# Patient Record
Sex: Female | Born: 1935 | Race: White | Hispanic: No | Smoking: Former smoker
Health system: Southern US, Community
[De-identification: ages and names within clinical notes are randomized; demographics above are authoritative.]

## PROBLEM LIST (undated history)

## (undated) DIAGNOSIS — J449 Chronic obstructive pulmonary disease, unspecified: Secondary | ICD-10-CM

## (undated) DIAGNOSIS — F419 Anxiety disorder, unspecified: Secondary | ICD-10-CM

## (undated) DIAGNOSIS — M1990 Unspecified osteoarthritis, unspecified site: Secondary | ICD-10-CM

## (undated) DIAGNOSIS — I639 Cerebral infarction, unspecified: Secondary | ICD-10-CM

## (undated) DIAGNOSIS — I10 Essential (primary) hypertension: Secondary | ICD-10-CM

## (undated) DIAGNOSIS — E119 Type 2 diabetes mellitus without complications: Secondary | ICD-10-CM

## (undated) HISTORY — PX: ESOPHAGEAL DILATION: SHX303

## (undated) HISTORY — PX: OTHER SURGICAL HISTORY: SHX169

---

## 2001-07-15 ENCOUNTER — Emergency Department (HOSPITAL_COMMUNITY): Admission: EM | Admit: 2001-07-15 | Discharge: 2001-07-15 | Payer: Self-pay | Admitting: Emergency Medicine

## 2001-07-15 ENCOUNTER — Encounter: Payer: Self-pay | Admitting: Emergency Medicine

## 2002-11-11 ENCOUNTER — Emergency Department (HOSPITAL_COMMUNITY): Admission: EM | Admit: 2002-11-11 | Discharge: 2002-11-11 | Payer: Self-pay | Admitting: Emergency Medicine

## 2003-04-26 ENCOUNTER — Ambulatory Visit (HOSPITAL_COMMUNITY): Admission: RE | Admit: 2003-04-26 | Discharge: 2003-04-26 | Payer: Self-pay | Admitting: Ophthalmology

## 2003-04-28 ENCOUNTER — Encounter: Payer: Self-pay | Admitting: Ophthalmology

## 2003-04-28 ENCOUNTER — Ambulatory Visit (HOSPITAL_COMMUNITY): Admission: RE | Admit: 2003-04-28 | Discharge: 2003-04-29 | Payer: Self-pay | Admitting: Ophthalmology

## 2003-05-15 ENCOUNTER — Encounter: Payer: Self-pay | Admitting: *Deleted

## 2003-05-15 ENCOUNTER — Emergency Department (HOSPITAL_COMMUNITY): Admission: EM | Admit: 2003-05-15 | Discharge: 2003-05-15 | Payer: Self-pay | Admitting: *Deleted

## 2003-12-11 ENCOUNTER — Emergency Department (HOSPITAL_COMMUNITY): Admission: EM | Admit: 2003-12-11 | Discharge: 2003-12-12 | Payer: Self-pay | Admitting: Emergency Medicine

## 2004-08-19 ENCOUNTER — Emergency Department (HOSPITAL_COMMUNITY): Admission: EM | Admit: 2004-08-19 | Discharge: 2004-08-19 | Payer: Self-pay | Admitting: Emergency Medicine

## 2005-06-28 ENCOUNTER — Other Ambulatory Visit: Admission: RE | Admit: 2005-06-28 | Discharge: 2005-06-28 | Payer: Self-pay | Admitting: Family Medicine

## 2005-08-19 ENCOUNTER — Observation Stay (HOSPITAL_COMMUNITY): Admission: EM | Admit: 2005-08-19 | Discharge: 2005-08-22 | Payer: Self-pay | Admitting: Emergency Medicine

## 2005-08-20 ENCOUNTER — Ambulatory Visit: Payer: Self-pay | Admitting: Internal Medicine

## 2008-10-13 ENCOUNTER — Ambulatory Visit (HOSPITAL_COMMUNITY): Admission: RE | Admit: 2008-10-13 | Discharge: 2008-10-13 | Payer: Self-pay | Admitting: Family Medicine

## 2008-11-13 ENCOUNTER — Inpatient Hospital Stay (HOSPITAL_COMMUNITY): Admission: EM | Admit: 2008-11-13 | Discharge: 2008-11-17 | Payer: Self-pay | Admitting: Emergency Medicine

## 2009-01-29 ENCOUNTER — Inpatient Hospital Stay (HOSPITAL_COMMUNITY): Admission: EM | Admit: 2009-01-29 | Discharge: 2009-02-03 | Payer: Self-pay | Admitting: Emergency Medicine

## 2009-01-30 ENCOUNTER — Ambulatory Visit: Payer: Self-pay | Admitting: Internal Medicine

## 2009-01-31 ENCOUNTER — Ambulatory Visit: Payer: Self-pay | Admitting: Internal Medicine

## 2009-03-20 ENCOUNTER — Ambulatory Visit (HOSPITAL_COMMUNITY): Admission: RE | Admit: 2009-03-20 | Discharge: 2009-03-20 | Payer: Self-pay | Admitting: Family Medicine

## 2009-05-27 ENCOUNTER — Emergency Department (HOSPITAL_COMMUNITY): Admission: EM | Admit: 2009-05-27 | Discharge: 2009-05-27 | Payer: Self-pay | Admitting: Emergency Medicine

## 2009-06-07 ENCOUNTER — Ambulatory Visit (HOSPITAL_COMMUNITY): Admission: RE | Admit: 2009-06-07 | Discharge: 2009-06-07 | Payer: Self-pay | Admitting: Family Medicine

## 2009-10-04 ENCOUNTER — Ambulatory Visit (HOSPITAL_COMMUNITY): Admission: RE | Admit: 2009-10-04 | Discharge: 2009-10-04 | Payer: Self-pay | Admitting: Family Medicine

## 2010-10-22 ENCOUNTER — Inpatient Hospital Stay (HOSPITAL_COMMUNITY)
Admission: EM | Admit: 2010-10-22 | Discharge: 2010-10-29 | Payer: Self-pay | Source: Home / Self Care | Admitting: Emergency Medicine

## 2010-10-29 ENCOUNTER — Inpatient Hospital Stay
Admission: AD | Admit: 2010-10-29 | Discharge: 2010-12-05 | Disposition: A | Discharge status: Other Acute Inpatient Hospital | Payer: Self-pay | Source: Home / Self Care | Attending: Family Medicine | Admitting: Family Medicine

## 2010-11-23 ENCOUNTER — Ambulatory Visit (HOSPITAL_COMMUNITY)
Admission: RE | Admit: 2010-11-23 | Discharge: 2010-11-23 | Payer: Self-pay | Source: Home / Self Care | Attending: Orthopaedic Surgery | Admitting: Orthopaedic Surgery

## 2010-12-05 ENCOUNTER — Inpatient Hospital Stay (HOSPITAL_COMMUNITY)
Admission: EM | Admit: 2010-12-05 | Discharge: 2010-12-07 | Payer: Self-pay | Source: Home / Self Care | Attending: Family Medicine | Admitting: Family Medicine

## 2010-12-07 ENCOUNTER — Inpatient Hospital Stay
Admission: AD | Admit: 2010-12-07 | Discharge: 2011-01-02 | Disposition: A | Discharge status: Other Acute Inpatient Hospital | Payer: Self-pay | Source: Home / Self Care | Attending: Family Medicine | Admitting: Family Medicine

## 2010-12-15 ENCOUNTER — Ambulatory Visit (HOSPITAL_COMMUNITY)
Admission: RE | Admit: 2010-12-15 | Discharge: 2010-12-15 | Payer: Self-pay | Source: Home / Self Care | Attending: Family Medicine | Admitting: Family Medicine

## 2010-12-19 LAB — GLUCOSE, CAPILLARY
Glucose-Capillary: 125 mg/dL — ABNORMAL HIGH (ref 70–99)
Glucose-Capillary: 349 mg/dL — ABNORMAL HIGH (ref 70–99)
Glucose-Capillary: 137 mg/dL — ABNORMAL HIGH (ref 70–99)
Glucose-Capillary: 198 mg/dL — ABNORMAL HIGH (ref 70–99)

## 2010-12-20 LAB — GLUCOSE, CAPILLARY
Glucose-Capillary: 148 mg/dL — ABNORMAL HIGH (ref 70–99)
Glucose-Capillary: 192 mg/dL — ABNORMAL HIGH (ref 70–99)
Glucose-Capillary: 281 mg/dL — ABNORMAL HIGH (ref 70–99)
Glucose-Capillary: 283 mg/dL — ABNORMAL HIGH (ref 70–99)

## 2010-12-21 LAB — GLUCOSE, CAPILLARY
Glucose-Capillary: 164 mg/dL — ABNORMAL HIGH (ref 70–99)
Glucose-Capillary: 162 mg/dL — ABNORMAL HIGH (ref 70–99)

## 2010-12-31 LAB — GLUCOSE, CAPILLARY
Glucose-Capillary: 207 mg/dL — ABNORMAL HIGH (ref 70–99)
Glucose-Capillary: 286 mg/dL — ABNORMAL HIGH (ref 70–99)
Glucose-Capillary: 126 mg/dL — ABNORMAL HIGH (ref 70–99)
Glucose-Capillary: 166 mg/dL — ABNORMAL HIGH (ref 70–99)
Glucose-Capillary: 268 mg/dL — ABNORMAL HIGH (ref 70–99)
Glucose-Capillary: 172 mg/dL — ABNORMAL HIGH (ref 70–99)
Glucose-Capillary: 122 mg/dL — ABNORMAL HIGH (ref 70–99)
Glucose-Capillary: 242 mg/dL — ABNORMAL HIGH (ref 70–99)
Glucose-Capillary: 302 mg/dL — ABNORMAL HIGH (ref 70–99)
Glucose-Capillary: 299 mg/dL — ABNORMAL HIGH (ref 70–99)
Glucose-Capillary: 287 mg/dL — ABNORMAL HIGH (ref 70–99)
Glucose-Capillary: 202 mg/dL — ABNORMAL HIGH (ref 70–99)
Glucose-Capillary: 196 mg/dL — ABNORMAL HIGH (ref 70–99)
Glucose-Capillary: 205 mg/dL — ABNORMAL HIGH (ref 70–99)
Glucose-Capillary: 148 mg/dL — ABNORMAL HIGH (ref 70–99)
Glucose-Capillary: 245 mg/dL — ABNORMAL HIGH (ref 70–99)
Glucose-Capillary: 290 mg/dL — ABNORMAL HIGH (ref 70–99)
Glucose-Capillary: 364 mg/dL — ABNORMAL HIGH (ref 70–99)
Glucose-Capillary: 262 mg/dL — ABNORMAL HIGH (ref 70–99)
Glucose-Capillary: 130 mg/dL — ABNORMAL HIGH (ref 70–99)
Glucose-Capillary: 171 mg/dL — ABNORMAL HIGH (ref 70–99)
Glucose-Capillary: 232 mg/dL — ABNORMAL HIGH (ref 70–99)
Glucose-Capillary: 325 mg/dL — ABNORMAL HIGH (ref 70–99)
Glucose-Capillary: 117 mg/dL — ABNORMAL HIGH (ref 70–99)
Glucose-Capillary: 117 mg/dL — ABNORMAL HIGH (ref 70–99)
Glucose-Capillary: 182 mg/dL — ABNORMAL HIGH (ref 70–99)
Glucose-Capillary: 202 mg/dL — ABNORMAL HIGH (ref 70–99)
Glucose-Capillary: 160 mg/dL — ABNORMAL HIGH (ref 70–99)
Glucose-Capillary: 201 mg/dL — ABNORMAL HIGH (ref 70–99)
Glucose-Capillary: 95 mg/dL (ref 70–99)
Glucose-Capillary: 104 mg/dL — ABNORMAL HIGH (ref 70–99)
Glucose-Capillary: 219 mg/dL — ABNORMAL HIGH (ref 70–99)
Glucose-Capillary: 267 mg/dL — ABNORMAL HIGH (ref 70–99)
Glucose-Capillary: 53 mg/dL — ABNORMAL LOW (ref 70–99)
Glucose-Capillary: 147 mg/dL — ABNORMAL HIGH (ref 70–99)
Glucose-Capillary: 267 mg/dL — ABNORMAL HIGH (ref 70–99)
Glucose-Capillary: 165 mg/dL — ABNORMAL HIGH (ref 70–99)
Glucose-Capillary: 257 mg/dL — ABNORMAL HIGH (ref 70–99)
Glucose-Capillary: 156 mg/dL — ABNORMAL HIGH (ref 70–99)
Glucose-Capillary: 190 mg/dL — ABNORMAL HIGH (ref 70–99)

## 2011-01-02 ENCOUNTER — Inpatient Hospital Stay (HOSPITAL_COMMUNITY)
Admission: AD | Admit: 2011-01-02 | Discharge: 2011-01-07 | Payer: Self-pay | Source: Home / Self Care | Attending: Family Medicine | Admitting: Family Medicine

## 2011-01-02 LAB — GLUCOSE, CAPILLARY
Glucose-Capillary: 159 mg/dL — ABNORMAL HIGH (ref 70–99)
Glucose-Capillary: 192 mg/dL — ABNORMAL HIGH (ref 70–99)
Glucose-Capillary: 119 mg/dL — ABNORMAL HIGH (ref 70–99)
Glucose-Capillary: 478 mg/dL — ABNORMAL HIGH (ref 70–99)
Glucose-Capillary: 234 mg/dL — ABNORMAL HIGH (ref 70–99)
Glucose-Capillary: 144 mg/dL — ABNORMAL HIGH (ref 70–99)
Glucose-Capillary: 180 mg/dL — ABNORMAL HIGH (ref 70–99)
Glucose-Capillary: 118 mg/dL — ABNORMAL HIGH (ref 70–99)
Glucose-Capillary: 220 mg/dL — ABNORMAL HIGH (ref 70–99)

## 2011-01-04 NOTE — Progress Notes (Signed)
  [name], [name]                 ACCOUNT NO.:  [id number]  MEDICAL RECORD NO.:  [id number]          PATIENT TYPE:  INP  LOCATION:  A219                          FACILITY:  [facility]  PHYSICIAN:  Mila Homer. [name], M.D.DATE OF BIRTH:  [birth year]  DATE OF PROCEDURE:  01/03/2011 DATE OF DISCHARGE:                                PROGRESS NOTE   SUBJECTIVE:  The patient feels a lot better.  In fact, she does not even remember yesterday morning.  At that time, I came to the nursing home, examined her, set up orders for admission and she does not even remember seeing me then.  She finally woke up around 3 o'clock yesterday afternoon.  OBJECTIVE:  VITAL SIGNS:  Temperature is 98.2, pulse 100, respiratory rate 18, blood pressure 133/61. GENERAL:  She has cushingoid facies.  Her speech is normal, sentence structure intact.  She appears to be oriented and alert.  She is well- developed and well-nourished. LUNGS:  Her lungs show some expiratory wheezing throughout.  She has rhonchi throughout with decreased breath sounds. HEART:  Her heart has a fairly regular rhythm, rate of about 90. EXTREMITIES:  There is no edema of the ankles.  LABORATORY DATA:  White cell count today is 8100, hemoglobin 9.7. Differential shows 69% neutrophils, 11 lymphs, 10 eos, mets 7, glucose 117.  O2 sats 97% on 4 liters.  Yesterday her D-dimer was greater than 20, so I went ahead and ordered a CT angio of the chest, which showed no evidence of pulmonary embolism, but did show small bilateral pleural effusions and possible consolidation of the left lower lobe.  She had diffuse thickening of the wall of the esophagus and healing fractures of the right 8th, 9th and 10th ribs.  ASSESSMENT: 1. Probable pneumonia 2. Hypoxia, cleared. 3. Confusion, probably secondary to hypoxia. 4. Chronic obstructive pulmonary disease. 5. History of tobacco use. 6. Status post CVA many years ago. 7. Elevated D-dimer, which might  be secondary to her rheumatoid     arthritis.  PLAN:  I have talked to Dr. Shaune Pollack, pulmonologist to [facility], who kindly saw her in consult and I reviewed his note.  She probably does have hospital-acquired pneumonia.  Given this, will continue her Zosyn and vancomycin,  and just use prophylactic Lovenox.  A BNP is pending.     Mila Homer. [name], M.D.     SDK/MEDQ  D:  01/03/2011  T:  01/03/2011  Job:  [id number]  Electronically Signed by John Giovanni M.D. on 01/04/2011 07:17:27 AM

## 2011-01-04 NOTE — H&P (Signed)
  NAMECATHERINE, [name]                 ACCOUNT NO.:  [id number]  MEDICAL RECORD NO.:  [id number]           PATIENT TYPE:  LOCATION:                                 FACILITY:  PHYSICIAN:  Mila Homer. [name], M.D.DATE OF BIRTH:  1935-07-20  DATE OF ADMISSION: DATE OF DISCHARGE:  LH                             HISTORY & PHYSICAL   This is a 76 year old woman who is currently at the [facility].  Yesterday, nursing called and let me know she just seemed to be a little bit confused.  She had run a temperature of 99.6 the night before but really showed no coughing or urinary problems.  She is currently convalescing from a hip fracture.  She had a recent admission to [facility] for pneumonia.  Medications she had been on include: 1. Zyrtec 10 mg daily. 2. Lotrel 10/20 daily. 3. Potassium chloride 20 mEq daily. 4. Pantoprazole 40 mg daily. 5. Methotrexate 2.5 mg 5 tablets each Thursday for rheumatoid    arthritis. 6. Cymbalta 60 mg daily. 7. Bumetanide 1 mg daily. 8. Prednisone 10 mg daily. 9. Simvastatin 40 mg daily. 10.Vitamin D3 at 50,000 units a week. 11.While hospitalized, she was on vancomycin and Zosyn. 12.She also is on alprazolam 1 mg p.r.n. anxiety and hydrocodone/APAP     5/325 p.r.n. severe pain.  Medical problems include: 1. Emphysema/COPD. 2. Reactive depression. 3. Vitamin D deficiency. 4. Rheumatoid arthritis. 5. Peripheral vascular disease. 6. Benign essential hypertension. 7. Recent hip fracture.  Today nursing continued to find her confused and checked her O2 sats, and they were only 48%.  Required a non-rebreather at 5 liters to get her O2 sats greater than 90%.  She ran a temp of roughly 101.  EXAMINATION:  Showed a pleasant elderly woman who had cushingoid facies. She had decreased breath sounds throughout her lungs. Her heart had a regular rhythm and rate of about 90, but heart sounds were distant, as with the breath  sounds. The abdomen was soft without tenderness, without organomegaly or mass. She had no obvious pain in the calves, no swelling of the distal legs.  ADMISSION DIAGNOSES: 1. Hypoxia. 2. Confusion probably secondary to hypoxia. 3. Presumptive pneumonia. 4. Chronic obstructive pulmonary disease. 5. History of tobacco use. 6. Status post cerebrovascular accident many years ago. 7. Recent hip fracture. 8. Vitamin D deficiency. 9. Rheumatoid arthritis. 10.Osteoporosis. 11.Benign essential hypertension. 12.Reactive depression.  The plan is to directly admit her to [facility], get chest x- ray, ABGs, CBC, BMP, start her back on Zosyn and vancomycin as per pharmacy, check a D-dimer, and put her on O2.     Mila Homer. [name], M.D.     SDK/MEDQ  D:  01/02/2011  T:  01/02/2011  Job:  [id number]  Electronically Signed by John Giovanni M.D. on 01/04/2011 07:17:21 AM

## 2011-01-07 ENCOUNTER — Inpatient Hospital Stay
Admission: AD | Admit: 2011-01-07 | Discharge: 2011-03-12 | Disposition: A | Discharge status: Other Acute Inpatient Hospital | Payer: Medicare Other | Attending: Family Medicine | Admitting: Family Medicine

## 2011-01-07 DIAGNOSIS — R935 Abnormal findings on diagnostic imaging of other abdominal regions, including retroperitoneum: Secondary | ICD-10-CM

## 2011-01-07 DIAGNOSIS — N390 Urinary tract infection, site not specified: Secondary | ICD-10-CM

## 2011-01-07 LAB — GLUCOSE, CAPILLARY
Glucose-Capillary: 155 mg/dL — ABNORMAL HIGH (ref 70–99)
Glucose-Capillary: 119 mg/dL — ABNORMAL HIGH (ref 70–99)
Glucose-Capillary: 118 mg/dL — ABNORMAL HIGH (ref 70–99)
Glucose-Capillary: 170 mg/dL — ABNORMAL HIGH (ref 70–99)
Glucose-Capillary: 205 mg/dL — ABNORMAL HIGH (ref 70–99)
Glucose-Capillary: 148 mg/dL — ABNORMAL HIGH (ref 70–99)
Glucose-Capillary: 142 mg/dL — ABNORMAL HIGH (ref 70–99)
Glucose-Capillary: 137 mg/dL — ABNORMAL HIGH (ref 70–99)
Glucose-Capillary: 165 mg/dL — ABNORMAL HIGH (ref 70–99)
Glucose-Capillary: 151 mg/dL — ABNORMAL HIGH (ref 70–99)

## 2011-01-07 LAB — BASIC METABOLIC PANEL
CO2: 31 mEq/L (ref 19–32)
Calcium: 9.2 mg/dL (ref 8.4–10.5)
Chloride: 94 mEq/L — ABNORMAL LOW (ref 96–112)
Chloride: 100 mEq/L (ref 96–112)
Creatinine, Ser: 1.13 mg/dL (ref 0.4–1.2)
GFR calc Af Amer: 57 mL/min — ABNORMAL LOW (ref >60)
GFR calc non Af Amer: 55 mL/min — ABNORMAL LOW (ref >60)
Potassium: 3.5 mEq/L (ref 3.5–5.1)
Sodium: 140 mEq/L (ref 135–145)

## 2011-01-07 LAB — URINE CULTURE: Colony Count: >=100000

## 2011-01-07 LAB — BLOOD GAS, ARTERIAL
O2 Saturation: 68 %
Patient temperature: 37
pCO2 arterial: 50.5 mmHg — ABNORMAL HIGH (ref 35.0–45.0)
pH, Arterial: 7.416 — ABNORMAL HIGH (ref 7.350–7.400)
pO2, Arterial: 38.7 mmHg — CL (ref 80.0–100.0)

## 2011-01-07 LAB — URINALYSIS, ROUTINE W REFLEX MICROSCOPIC
Bilirubin Urine: NEGATIVE
Specific Gravity, Urine: 1.015 (ref 1.005–1.030)
Urine Glucose, Fasting: NEGATIVE mg/dL

## 2011-01-07 LAB — DIFFERENTIAL
Basophils Absolute: 0 10*3/uL (ref 0.0–0.1)
Basophils Absolute: 0.1 10*3/uL (ref 0.0–0.1)
Basophils Relative: 0 % (ref 0–1)
Basophils Relative: 1 % (ref 0–1)
Eosinophils Absolute: 0.4 10*3/uL (ref 0.0–0.7)
Eosinophils Relative: 10 % — ABNORMAL HIGH (ref 0–5)
Lymphocytes Relative: 14 % (ref 12–46)
Lymphocytes Relative: 11 % — ABNORMAL LOW (ref 12–46)
Lymphs Abs: 0.9 10*3/uL (ref 0.7–4.0)
Monocytes Relative: 11 % (ref 3–12)
Monocytes Relative: 9 % (ref 3–12)
Neutro Abs: 6.8 10*3/uL (ref 1.7–7.7)
Neutrophils Relative %: 69 % (ref 43–77)

## 2011-01-07 LAB — CBC
HCT: 30.4 % — ABNORMAL LOW (ref 36.0–46.0)
HCT: 29.3 % — ABNORMAL LOW (ref 36.0–46.0)
Hemoglobin: 9.7 g/dL — ABNORMAL LOW (ref 12.0–15.0)
MCH: 31.8 pg (ref 26.0–34.0)
MCHC: 33.1 g/dL (ref 30.0–36.0)
MCV: 94.7 fL (ref 78.0–100.0)
Platelets: 197 10*3/uL (ref 150–400)
RBC: 3.21 MIL/uL — ABNORMAL LOW (ref 3.87–5.11)
RDW: 15.1 % (ref 11.5–15.5)
WBC: 9.6 10*3/uL (ref 4.0–10.5)

## 2011-01-07 LAB — MRSA PCR SCREENING: MRSA by PCR: NEGATIVE

## 2011-01-07 LAB — BRAIN NATRIURETIC PEPTIDE: Pro B Natriuretic peptide (BNP): 77.9 pg/mL (ref 0.0–100.0)

## 2011-01-08 ENCOUNTER — Ambulatory Visit (HOSPITAL_COMMUNITY)
Admission: RE | Admit: 2011-01-08 | Discharge: 2011-01-08 | Payer: Self-pay | Source: Home / Self Care | Attending: Orthopaedic Surgery | Admitting: Orthopaedic Surgery

## 2011-01-08 LAB — BASIC METABOLIC PANEL
BUN: 8 mg/dL (ref 6–23)
Calcium: 9.2 mg/dL (ref 8.4–10.5)
Creatinine, Ser: 1.07 mg/dL (ref 0.4–1.2)
GFR calc Af Amer: >60 mL/min (ref >60)
GFR calc non Af Amer: 50 mL/min — ABNORMAL LOW (ref >60)

## 2011-01-08 LAB — GLUCOSE, CAPILLARY
Glucose-Capillary: 159 mg/dL — ABNORMAL HIGH (ref 70–99)
Glucose-Capillary: 229 mg/dL — ABNORMAL HIGH (ref 70–99)
Glucose-Capillary: 240 mg/dL — ABNORMAL HIGH (ref 70–99)
Glucose-Capillary: 188 mg/dL — ABNORMAL HIGH (ref 70–99)
Glucose-Capillary: 153 mg/dL — ABNORMAL HIGH (ref 70–99)
Glucose-Capillary: 161 mg/dL — ABNORMAL HIGH (ref 70–99)
Glucose-Capillary: 171 mg/dL — ABNORMAL HIGH (ref 70–99)
Glucose-Capillary: 152 mg/dL — ABNORMAL HIGH (ref 70–99)

## 2011-01-08 LAB — CBC
MCH: 30.7 pg (ref 26.0–34.0)
MCHC: 32.2 g/dL (ref 30.0–36.0)
MCV: 95.5 fL (ref 78.0–100.0)
Platelets: 272 10*3/uL (ref 150–400)
RDW: 15.2 % (ref 11.5–15.5)

## 2011-01-08 LAB — DIFFERENTIAL
Basophils Relative: 0 % (ref 0–1)
Eosinophils Absolute: 0 10*3/uL (ref 0.0–0.7)
Eosinophils Relative: 0 % (ref 0–5)
Lymphs Abs: 0.8 10*3/uL (ref 0.7–4.0)
Monocytes Relative: 7 % (ref 3–12)
Neutrophils Relative %: 83 % — ABNORMAL HIGH (ref 43–77)

## 2011-01-09 LAB — GLUCOSE, CAPILLARY
Glucose-Capillary: 253 mg/dL — ABNORMAL HIGH (ref 70–99)
Glucose-Capillary: 173 mg/dL — ABNORMAL HIGH (ref 70–99)
Glucose-Capillary: 154 mg/dL — ABNORMAL HIGH (ref 70–99)
Glucose-Capillary: 195 mg/dL — ABNORMAL HIGH (ref 70–99)

## 2011-01-10 LAB — GLUCOSE, CAPILLARY
Glucose-Capillary: 180 mg/dL — ABNORMAL HIGH (ref 70–99)
Glucose-Capillary: 131 mg/dL — ABNORMAL HIGH (ref 70–99)

## 2011-01-11 LAB — GLUCOSE, CAPILLARY
Glucose-Capillary: 107 mg/dL — ABNORMAL HIGH (ref 70–99)
Glucose-Capillary: 171 mg/dL — ABNORMAL HIGH (ref 70–99)

## 2011-01-11 NOTE — Progress Notes (Signed)
  [name], [name]                 ACCOUNT NO.:  [id number]  MEDICAL RECORD NO.:  [id number]          PATIENT TYPE:  INP  LOCATION:  A219                          FACILITY:  [facility]  PHYSICIAN:  Mila Homer. [name], M.D.DATE OF BIRTH:  08-04-1935  DATE OF PROCEDURE: DATE OF DISCHARGE:                                PROGRESS NOTE   SUBJECTIVE:  Generally, she is feeling better, but still nauseated after getting antibiotics.  OBJECTIVE:  VITALS:  Her temperature is 98 degrees, pulse 82, respiratory rate 18, blood pressure 144/72, O2 sat is 92% on 2 liters. GENERAL:  She appears to be oriented, alert.  She is in no acute distress this morning.  She is well-developed and obese.  She has cushingoid facies. LUNGS:  Show decreased breath sounds with expiratory wheezing and rhonchi.  She is moving air well, however. HEART:  Fairly regular rhythm, rate of about 80.  There is trace edema of the ankles.  ASSESSMENT: 1. Pneumonia 2. Hypoxia, cleared. 3. Chronic obstructive pulmonary disease. 4. Confusion, improved.  PLAN:  Continue IV antibiotics of Zosyn and vancomycin, IV fluids, antihypertensives.     Mila Homer. [name], M.D.     SDK/MEDQ  D:  01/05/2011  T:  01/05/2011  Job:  [id number]  Electronically Signed by John Giovanni M.D. on 01/09/2011 05:17:00 AM

## 2011-01-11 NOTE — Progress Notes (Signed)
  [name], MILO                 ACCOUNT NO.:  [id number]  MEDICAL RECORD NO.:  [id number]          PATIENT TYPE:  INP  LOCATION:  A219                          FACILITY:  [facility]  PHYSICIAN:  Mila Homer. [name], M.D.DATE OF BIRTH:  1935/10/27  DATE OF PROCEDURE:  01/04/2011 DATE OF DISCHARGE:                                PROGRESS NOTE   SUBJECTIVE:  She feels somewhat better today.  She tells me, however, that there were people crawling around her floor last night.  She was not sure if the woman came in last night was a nurse or not.  OBJECTIVE:  VITAL SIGNS:  Temperature is 97.5, pulse 77, respiratory rate 18, blood pressure 168/73.  GENERAL:  She has friends visiting. She is in no acute distress.  She is well developed, somewhat cushingoid. LUNGS:  Show decreased breath sounds throughout, but she is moving air well without intercostal retraction.  She does have occasional rhonchi and wheezes. HEART:  Regular rhythm rate of about 70. EXTREMITIES:  There is no edema of the ankles.  ASSESSMENT: 1. Probable pneumonia. 2. Hypoxia cleared. 3. Confusion which is still ongoing possibly secondary to hypoxia or     to age, plus pneumonia, plus hospitalization. 4. Chronic obstructive pulmonary disease.  PLAN:  Continue IV antibiotics and fluids.  She is currently not on steroids.  We will add prednisone back to her regimen.     Mila Homer. [name], M.D.     SDK/MEDQ  D:  01/04/2011  T:  01/04/2011  Job:  [id number]  Electronically Signed by John Giovanni M.D. on 01/09/2011 05:16:50 AM

## 2011-01-11 NOTE — Progress Notes (Signed)
  [name], [name]                 ACCOUNT NO.:  [id number]  MEDICAL RECORD NO.:  [id number]          PATIENT TYPE:  INP  LOCATION:  A219                          FACILITY:  [facility]  PHYSICIAN:  Mila Homer. [name], M.D.DATE OF BIRTH:  17-Mar-1935  DATE OF PROCEDURE: DATE OF DISCHARGE:                                PROGRESS NOTE   She tells me she feels a good deal better.  She was having what appeared to be hallucinations the other day but, apparently, these have cleared.  OBJECTIVE:  She is sitting up in bed.  Her temperature is 97.1, pulse 96, respiratory rate 22, blood pressure 151/71.  She is in no acute distress.  She is well-developed, obese and has cushingoid facies.  She has what appears to be bleeding into the left sclera.  The right is normal.  Her lungs show decreased breath sounds throughout.  Her heart has a regular rhythm and rate of about 90, but heart sounds are faint. There is no abdominal pain.  Her urine grew out greater than 100,000 Proteus mirabilis which showed multiple resistant pattern but was sensitive to ceftriaxone.  O2 SATs 88% on 4 liters.  ASSESSMENT: 1. Proteus mirabilis urinary tract infection. 2. Possible pneumonia 3. Persistent hypoxia. 4. Chronic obstructive pulmonary disease. 5. Confusion, probably of multiple etiologies including chronic     obstructive pulmonary disease, infection, hypoxia. 6. Scleral hematoma.  PLAN:  Will discontinue vancomycin and Zosyn and switch to Rocephin 1 gram IV q.24 hours.  Her IV will be discontinued and start a Hep-Lock. She will be up in a chair.  I am probably not treating this scleral hematoma.  Hopefully, if she continues to do well, I will be able to transfer her back to the [facility] facility tomorrow.     Mila Homer. [name], M.D.     SDK/MEDQ  D:  01/06/2011  T:  01/06/2011  Job:  [id number]  Electronically Signed by John Giovanni M.D. on 01/09/2011 05:17:15 AM

## 2011-01-11 NOTE — Discharge Summary (Signed)
NAMEMARYURI, WARNKE                 ACCOUNT NO.:  [id number]  MEDICAL RECORD NO.:  [id number]          PATIENT TYPE:  INP  LOCATION:  A219                          FACILITY:  [facility]  PHYSICIAN:  Mila Homer. [name], M.D.DATE OF BIRTH:  1935/03/25  DATE OF ADMISSION:  01/02/2011 DATE OF DISCHARGE:  01/23/2012LH                              DISCHARGE SUMMARY   This 76 year old was admitted to the hospital with what seemed to be pneumonia, what turned out to be urinary tract infection.  She had a benign 6-day hospitalization extending from January 18th to January 07, 2011.  Vital signs remained stable.  Admission white cell count was 9600 with 71 neutrophils, hemoglobin 10.2.  BMP showed a chloride of 94, glucose 144.  ABGs on room air showed pH 7.41, pCO2 of 50, pO2 of 38.  D-dimer was greater than 20.  UA showed moderate leukocytes on stick and on scope too numerous to count WBCs, 3-6 RBCs, and many bacteria per HPF.  MRSA screening was negative.  Recheck white cell count the second day was 8100 with a normal diff. BMP showed a glucose 117.  BNP was 77.  The urine culture grew out greater than 100,000 Proteus mirabilis with a multiple resistant pattern.  It was sensitive to ampicillin and ceftriaxone, however.  She was admitted to the hospital with hypoxia, confusion, and presumptive pneumonia.  Admission chest x-ray showed left lower lobe air space density felt to be consent with acute pneumonia and CT angio of the chest showed no pulmonary embolism with small bilateral pleural effusions, with a possible consolidation of left lower lobe, and diffuse thickening of the wall of the esophagus and healing fractures of the right eighth, ninth and tenth ribs.  She was put on IV of normal saline at 75 cc/hr and initially on vancomycin, Zosyn, feeling she had a health facility acquired pneumonia. She was also put on Lovenox prophylactically and then therapeutically until the results of  the CT angio came back.  The hospital meds included: 1. Alprazolam 1 mg q.i.d. p.r.n. anxiety. 2. Tylenol 650 q.i.d. p.r.n. fever or pain. 3. Hydrocodone/APAP 5/325 q.i.d. p.r.n. pain. 4. Pantoprazole 40 mg daily. 5. EC ASA 81 mg daily. 6. O2 to maintain O2 SATs greater than 90%. 7. She had Accu-Cheks a.c. and at bedtime. with Novolog sliding     scale insulin.  When the results of her urine C and S came back vancomycin and Zosyn were discontinued and she was put on ceftriaxone 1 gram IV daily.  IV was discontinued and she was put on a Hep-Lock.  She was ready for discharge back to the nursing facility by her sixth day, and was much improved at that time.  DISCHARGE DIAGNOSES: 1. Proteus mirabilis urinary tract infection. 2. Possible left lower lobe pneumonia. 3. Hypoxia, multifactorial etiology. 4. Chronic obstructive pulmonary disease. 5. Confusion, secondary to hypoxia. 6. Rheumatoid arthritis. 7. Scleral hematoma on the left. 8. History of tobacco abuse. 9. History of cerebrovascular accident. 10.Osteoporosis. 11.Benign essential hypertension. 12.Reactive depression.  She is discharged back to nursing facility on the following meds: 1. Ceftriaxone gram IV for  seven days. 2. Prednisone 10 mg daily for seven days. 3. EC ASA 81 mg daily. 4. Acetaminophen 325 one to two q.4 h. for pain or fever. 5. Alprazolam 1 mg q.i.d. 6. Bumetanide 1 mg daily for ankle edema. 7. Kay Ciel 21 mEq daily with bumetanide. 8. Cetirizine 10 mg daily for allergy. 9. Cymbalta 60 mg daily. 10.DuoNebs q.4 h. 11.Hydrocodone/APAP 5/325 two tablets q.i.d. for severe pain. 12.Lotrel 10/20 daily. 13.Methotrexate 2.5 mg five tablets weekly on Thursdays. 14.Qvar 80, puff b.i.d. 15.Simvastatin 40 mg daily. 16.Vitamin D3 50,000 units weekly. 17.Sliding scale NovoLog. 18.She also was on a Glucerna shake and p.r.n. milk of magnesia.  Follow-up will be in the office or at the nursing facility within  the week.     Mila Homer. [name], M.D.     SDK/MEDQ  D:  01/07/2011  T:  01/07/2011  Job:  [id number]  Electronically Signed by John Giovanni M.D. on 01/09/2011 05:16:39 AM

## 2011-01-12 LAB — GLUCOSE, CAPILLARY
Glucose-Capillary: 122 mg/dL — ABNORMAL HIGH (ref 70–99)
Glucose-Capillary: 239 mg/dL — ABNORMAL HIGH (ref 70–99)

## 2011-01-12 NOTE — Progress Notes (Signed)
  [name], [name]                 ACCOUNT NO.:  [id number]  MEDICAL RECORD NO.:  [id number]          PATIENT TYPE:  INP  LOCATION:  A219                          FACILITY:  [facility]  PHYSICIAN:  [name], M.D.DATE OF BIRTH:  09/11/1935  DATE OF PROCEDURE: DATE OF DISCHARGE:                                PROGRESS NOTE   [name] is a patient of Dr. [name] and she has been admitted with healthcare-associated pneumonia.  She also had a hip fracture.  She says that she feels better, but she is still having some nausea, however. She has no other new complaints.  Her physical examination shows that she is awake and alert.  Her heart is regular without gallop.  Her abdomen is soft.  She is a bit uncomfortable with the nausea.  Her chest seems to be clear in general. Her heart is regular.  ASSESSMENT:  I think she is overall improving.  PLAN:  She is to continue with her current medications and treatment. No changes today.  She has Zofran ordered for nausea and she has just had some.     [name], M.D.     ELH/MEDQ  D:  01/05/2011  T:  01/05/2011  Job:  [id number]  Electronically Signed by Kari Baars M.D. on 01/12/2011 10:40:57 AM

## 2011-01-12 NOTE — Progress Notes (Signed)
  NAMEANGELIGUE, BOWNE                 ACCOUNT NO.:  [id number]  MEDICAL RECORD NO.:  [id number]          PATIENT TYPE:  INP  LOCATION:  A219                          FACILITY:  [facility]  PHYSICIAN:  Rane Blitch L. Juanetta Gosling, M.D.DATE OF BIRTH:  [birth year]  DATE OF PROCEDURE: DATE OF DISCHARGE:                                PROGRESS NOTE   Patient of Dr. [name].  Ms. Drabik is overall I think about the same.  She is still mildly confused but not as bad as she was.  She seems to be feeling a little bit better.  She is still coughing.  PHYSICAL EXAMINATION:  GENERAL:  Her exam shows that she is awake and alert, but confused. VITAL SIGNS:  As recorded. CHEST:  Shows rhonchi bilaterally. HEART:  Regular without murmur, gallop, or rub. ABDOMEN:  Soft.  ASSESSMENT:  She had a very high D-dimer, but did not show evidence of pulmonary embolus.  She did show pneumonia and I think this is a healthcare-associated pneumonia for which she is being treated appropriately.  She has no other new complaints now.  I will continue with her treatments and follow.  I have discussed this situation with Dr. [name].     Wrenly Lauritsen L. Juanetta Gosling, M.D.     ELH/MEDQ  D:  01/03/2011  T:  01/03/2011  Job:  [id number]  Electronically Signed by Kari Baars M.D. on 01/12/2011 10:40:53 AM

## 2011-01-12 NOTE — Progress Notes (Signed)
  NAMEEMILYANN, BANKA                 ACCOUNT NO.:  [id number]  MEDICAL RECORD NO.:  [id number]          PATIENT TYPE:  INP  LOCATION:  A219                          FACILITY:  [facility]  PHYSICIAN:  Edward L. Juanetta Gosling, M.D.DATE OF BIRTH:  06/12/1935  DATE OF PROCEDURE: DATE OF DISCHARGE:                                PROGRESS NOTE   Ms. Eckley is a patient of Dr. Lindaann Slough who is being treated for healthcare-associated pneumonia.  She is recovering from a hip fracture. She is doing fairly well, says that she is breathing a little bit better.  Her IV access is somewhat tenuous and I think we need to get a PICC line in since she is going to be on Zosyn and vancomycin.  Her exam this morning shows her temperature is 97.5, pulse 77, respirations 18, blood pressure 162/73, O2 sats 92% but was on oxygen, it was 87% on room air.  ASSESSMENT:  She has pneumonia.  PLAN:  Continue with her current treatments of medications, see if we can get her up and moving and get the PICC line, continue with everything else and follow.     Edward L. Juanetta Gosling, M.D.     ELH/MEDQ  D:  01/04/2011  T:  01/04/2011  Job:  [id number]  Electronically Signed by Kari Baars M.D. on 01/12/2011 10:41:02 AM

## 2011-01-12 NOTE — Progress Notes (Signed)
  NAMEARNEDA, [name]                 ACCOUNT NO.:  [id number]  MEDICAL RECORD NO.:  [id number]          PATIENT TYPE:  INP  LOCATION:  A219                          FACILITY:  [facility]  PHYSICIAN:  Edward L. Juanetta Gosling, M.D.DATE OF BIRTH:  Apr 24, 1935  DATE OF PROCEDURE:  01/06/2011 DATE OF DISCHARGE:  01/07/2011                                PROGRESS NOTE   Ms. Coletta continues to do better.  She does have Proteus in her urine and she is being treated appropriately for that.  She has no other new complaints.  PHYSICAL EXAMINATION:  GENERAL:  This morning, shows that she is awake and alert. CHEST:  Clear. HEART:  Regular. ABDOMEN:  Soft. VITAL SIGNS:  Temperature is 97.7, pulse 96, respirations 22, blood pressure 151/71, O2 sat was 88% on 4 liters, but it is in the 90s now on 4 liters.  ASSESSMENT:  She is better and she had what looked like a healthcare- associated pneumonia, it is not clear that that is what she has at this point.  She has Proteus in her urine.  She has rheumatoid arthritis. She should have a hip fracture.  She has got multiple problems.  She seems to be improving.     Edward L. Juanetta Gosling, M.D.     ELH/MEDQ  D:  01/07/2011  T:  01/07/2011  Job:  [id number]  Electronically Signed by Kari Baars M.D. on 01/12/2011 10:41:06 AM

## 2011-01-12 NOTE — Progress Notes (Signed)
  [name], [name]                 ACCOUNT NO.:  [id number]  MEDICAL RECORD NO.:  [id number]          PATIENT TYPE:  INP  LOCATION:  A219                          FACILITY:  [facility]  PHYSICIAN:  Josslyn Ciolek L. Juanetta Gosling, M.D.DATE OF BIRTH:  Aug 07, 1935  DATE OF PROCEDURE:  01/07/2011 DATE OF DISCHARGE:  01/07/2011                                PROGRESS NOTE   Ms. Scovill is hopeful of going back to the skilled care facility today. She has no new complaints.  PHYSICAL EXAMINATION:  VITAL SIGNS:  Her exam shows that her temperature is 98.2, pulse 91, respirations 22, blood pressure 147/73, O2 sats 95% on 4 liters. GENERAL:  She looks more comfortable. CHEST:  Clear. HEART:  Regular. ABDOMEN:  Soft.  In overall, I think she is better.  ASSESSMENT:  She is improved.  PLAN:  She may be able to be discharged depending on how Dr. [name] think she is doing.     Juno Alers L. Juanetta Gosling, M.D.     ELH/MEDQ  D:  01/07/2011  T:  01/07/2011  Job:  [id number]  Electronically Signed by Kari Baars M.D. on 01/12/2011 10:41:10 AM

## 2011-01-12 NOTE — Consult Note (Signed)
  NAMELESLEE, SUIRE                 ACCOUNT NO.:  [id number]  MEDICAL RECORD NO.:  [id number]          PATIENT TYPE:  INP  LOCATION:  A219                          FACILITY:  [facility]  PHYSICIAN:  Raquan Iannone L. Juanetta Gosling, M.D.DATE OF BIRTH:  [birth year]  DATE OF CONSULTATION: DATE OF DISCHARGE:                                CONSULTATION   Patient of Dr. [name].  REASON FOR CONSULTATION:  Hospital-acquired pneumonia, shortness of breath.  HISTORY:  Ms. Bernales is a 77 year old who is living at the [facility].  She has been recovering from the hip fracture and had been hospitalized earlier with pneumonia.  Apparently, she became short of breath and confused and was brought to the hospital.  It was concern that she had perhaps pulmonary embolus considering her clinical situation, but her CT today is negative.  PAST MEDICAL HISTORY:  Positive for COPD, depression, rheumatoid arthritis, peripheral vascular disease, hypertension.  She has had a recent hip fracture.  SOCIAL HISTORY:  She is an ex-cigarette smoker.  I cannot get too much history from her because she is confused and her family history is also not obtainable right now.  PHYSICAL EXAMINATION:  GENERAL:  Her exam shows that she is awake, but confused. VITAL SIGNS:  Her blood pressure is 120/80, pulse is 80, her respirations about 18.  She is afebrile. HEENT:  Her pupils are reactive.  Nose and throat are clear. CHEST:  Shows decreased breath sounds bilaterally. HEART:  Regular without gallop. ABDOMEN:  Soft without masses. EXTREMITIES:  Showed no edema.  ASSESSMENT:  She has CT findings that looks like pneumonia and at this point, I think that is why she is treated for.  She was on full-dose Lovenox, but by CT criteria, she does not have a pulmonary embolus, so I would stop that and put on prophylactic dose at this point.  I would otherwise continue with all of her other treatments.  Continue with her medications  and no other changes.  Thanks for allowing me to see her with you.     Jujhar Everett L. Juanetta Gosling, M.D.     ELH/MEDQ  D:  01/02/2011  T:  01/03/2011  Job:  [id number]  Electronically Signed by Kari Baars M.D. on 01/12/2011 10:40:49 AM

## 2011-01-13 LAB — GLUCOSE, CAPILLARY: Glucose-Capillary: 109 mg/dL — ABNORMAL HIGH (ref 70–99)

## 2011-01-14 LAB — GLUCOSE, CAPILLARY
Glucose-Capillary: 219 mg/dL — ABNORMAL HIGH (ref 70–99)
Glucose-Capillary: 115 mg/dL — ABNORMAL HIGH (ref 70–99)
Glucose-Capillary: 232 mg/dL — ABNORMAL HIGH (ref 70–99)

## 2011-01-15 ENCOUNTER — Ambulatory Visit (HOSPITAL_COMMUNITY)
Admission: RE | Admit: 2011-01-15 | Discharge: 2011-01-15 | Payer: Self-pay | Source: Home / Self Care | Attending: Family Medicine | Admitting: Family Medicine

## 2011-01-15 LAB — GLUCOSE, CAPILLARY: Glucose-Capillary: 105 mg/dL — ABNORMAL HIGH (ref 70–99)

## 2011-01-16 LAB — GLUCOSE, CAPILLARY: Glucose-Capillary: 223 mg/dL — ABNORMAL HIGH (ref 70–99)

## 2011-01-17 LAB — GLUCOSE, CAPILLARY: Glucose-Capillary: 135 mg/dL — ABNORMAL HIGH (ref 70–99)

## 2011-01-18 ENCOUNTER — Ambulatory Visit (HOSPITAL_COMMUNITY)
Admit: 2011-01-18 | Discharge: 2011-01-18 | Disposition: A | Discharge status: Home / Self Care | Payer: Medicare Other | Attending: Internal Medicine | Admitting: Internal Medicine

## 2011-01-18 DIAGNOSIS — R109 Unspecified abdominal pain: Secondary | ICD-10-CM | POA: Insufficient documentation

## 2011-01-18 DIAGNOSIS — K8689 Other specified diseases of pancreas: Secondary | ICD-10-CM | POA: Insufficient documentation

## 2011-01-18 LAB — GLUCOSE, CAPILLARY
Glucose-Capillary: 125 mg/dL — ABNORMAL HIGH (ref 70–99)
Glucose-Capillary: 233 mg/dL — ABNORMAL HIGH (ref 70–99)
Glucose-Capillary: 143 mg/dL — ABNORMAL HIGH (ref 70–99)

## 2011-01-19 LAB — GLUCOSE, CAPILLARY
Glucose-Capillary: 128 mg/dL — ABNORMAL HIGH (ref 70–99)
Glucose-Capillary: 195 mg/dL — ABNORMAL HIGH (ref 70–99)

## 2011-01-20 LAB — GLUCOSE, CAPILLARY
Glucose-Capillary: 128 mg/dL — ABNORMAL HIGH (ref 70–99)
Glucose-Capillary: 156 mg/dL — ABNORMAL HIGH (ref 70–99)

## 2011-01-21 LAB — GLUCOSE, CAPILLARY
Glucose-Capillary: 225 mg/dL — ABNORMAL HIGH (ref 70–99)
Glucose-Capillary: 186 mg/dL — ABNORMAL HIGH (ref 70–99)

## 2011-01-22 LAB — GLUCOSE, CAPILLARY
Glucose-Capillary: 87 mg/dL (ref 70–99)
Glucose-Capillary: 214 mg/dL — ABNORMAL HIGH (ref 70–99)

## 2011-01-23 LAB — GLUCOSE, CAPILLARY: Glucose-Capillary: 195 mg/dL — ABNORMAL HIGH (ref 70–99)

## 2011-01-24 LAB — GLUCOSE, CAPILLARY
Glucose-Capillary: 166 mg/dL — ABNORMAL HIGH (ref 70–99)
Glucose-Capillary: 126 mg/dL — ABNORMAL HIGH (ref 70–99)
Glucose-Capillary: 233 mg/dL — ABNORMAL HIGH (ref 70–99)

## 2011-01-24 NOTE — H&P (Signed)
[name], [name]                 ACCOUNT NO.:  [id number]  MEDICAL RECORD NO.:  [id number]          PATIENT TYPE:  OUT  LOCATION:  RAD                           FACILITY:  [facility]  PHYSICIAN:  [name], M.D.DATE OF BIRTH:  [birth year]  DATE OF ADMISSION:  01/08/2011 DATE OF DISCHARGE:  01/24/2012LH                             HISTORY & PHYSICAL   DISPOSITION:  Admission to the [facility] nursing facility.  This 76 year old was doing well at the [facility] nursing center, recovering from a hip fracture, when she developed some confusion and low O2 levels.  She was admitted to [facility], where she was found have a proteus mirabilis urinary tract infection and a possible left lower lobe pneumonia.  She had a benign 6-day hospitalization at [facility] before being able to be readmitted back to the nursing facility.  Other diagnoses she has had include chronic obstructive pulmonary disease, rheumatoid arthritis, history of a CVA, osteoporosis, reactive depression, benign essential hypertension.  While in the hospital, she developed a left scleral hematoma, with decrease in her vision, but according to her, that is increased now.  The patient herself feels well now, several days after discharge from the hospital.  She is in no acute distress.  She is in a wheelchair. She is well-developed, well-nourished with cushingoid facies.  Her speech is normal, sentence structure intact.  Affect appears fairly good, but she remains somewhat depressed about her episode of confusion and delirium in the hospital.  She is also somewhat depressed about having a number of medical problems severe enough to require hospitalization.  Left eye still shows scleral hematoma, but it is better, and there is less edema.  There are negative anterior cervical nodes.  No axillary adenopathy.  Lungs show decreased breath sounds throughout, but she is moving air well.  The heart shows  decreased heart sounds at a rate of about 80.  Abdomen is soft without organomegaly or mass or tenderness.  She has no CVA or flank pain.  There is no edema of the ankles.  RE-ADMITTING DIAGNOSES TO THE [facility]: 1. Recent Proteus mirabilis urinary tract infection. 2. Recent possible left lower lobe pneumonia. 3. Hypoxia multifactorial, etiology now cleared. 4. Delirium secondary to hypoxia infection. 5. Chronic obstructive pulmonary disease. 6. Left scleral hematoma improving. 7. Rheumatoid arthritis. 8. History tobacco use. 9. History of cerebrovascular accident. 10.Osteoporosis. 11.Benign essential hypertension. 12.Reactive depression. 13.Recent hip fracture.  At the nursing facility, She is receiving a 7-day course of IV ceftriaxone 1 g a day.  She will also continue prednisone 10 mg daily for 7 days, and then that will be discontinued.  She is to continue [facility] ASA 81 mg daily, acetaminophen p.r.n., alprazolam 1 mg q.i.d. p.r.n., bumetanide 1 mg daily for ankle edema and KCl 20 mEq with bumetanide, cetirizine 10 mg daily p.r.n. allergy, Cymbalta 60 mg daily, DuoNeb q.4 h for breathing, Lotrel 10/20 daily, Q-var 80 puffs b.i.d., simvastatin 40 mg daily, vitamin D3 50,000 units a week, hydrocodone/APAP 5/325 two tablets q.i.d. maximum dose of severe pain, and methotrexate 2.5 mg 5  tablets weekly on Thursdays.  She also has sliding scale NovoLog if needed.  She will continue with her physical therapy, and once her hip has improved to the point that she can ambulate on her own, she will be able to be discharged home.     [name], M.D.     SDK/MEDQ  D:  01/10/2011  T:  01/10/2011  Job:  [id number]  Electronically Signed by John Giovanni M.D. on 01/24/2011 03:36:09 AM

## 2011-01-24 NOTE — Progress Notes (Signed)
  [name], [name]                 ACCOUNT NO.:  [id number]  MEDICAL RECORD NO.:  [id number]           PATIENT TYPE:  LOCATION:                                 FACILITY:  PHYSICIAN:  Mila Homer. [name], M.D.DATE OF BIRTH:  06-10-1935  DATE OF PROCEDURE:  01/15/2011 DATE OF DISCHARGE:                                PROGRESS NOTE   SUBJECTIVE:  I was called by Nursing acutely to see this patient, who has been running a fever for a couple of days and just has not been herself.  She had a recent urinary tract infection which she was delirious and had to be admitted to [facility].  OBJECTIVE:  VITAL SIGNS:  Today, her temperature is 98.7, pulse 101, respiratory rate 22, blood pressure 173/72. GENERAL:  She is sitting up in bed having a breathing treatment.  Her color does not look good, despite being on the mask.  She is able to speak but tells me she just does not feel well and, in fact, has felt hot the last two days. LUNGS:  Her lungs show decreased breath sounds throughout.  She is moving air well, however. HEART:  Her heart sounds are faint and running about 100.  There is no edema of the ankles. ABDOMEN:  There is no abdominal pain.  ASSESSMENT: 1. Presumptive urinary tract infection. 2. Possible pneumonia.  PLAN:  I talked to her personal nurse.  We are getting a STAT urine for UA, C&S, STAT CBC and BMP, STAT portable chest x-ray.  Will start her on Rocephin 1 gram IM STAT immediately after urine is drawn.  Nursing to follow up with me later with the results of this.     Mila Homer. [name], M.D.     SDK/MEDQ  D:  01/15/2011  T:  01/15/2011  Job:  [id number]  Electronically Signed by John Giovanni M.D. on 01/24/2011 03:36:26 AM

## 2011-01-24 NOTE — Progress Notes (Signed)
  [name], [name]                 ACCOUNT NO.:  [id number]  MEDICAL RECORD NO.:  [id number]          PATIENT TYPE:  ORB  LOCATION:  S145                          FACILITY:  [facility]  PHYSICIAN:  Mila Homer. [name], M.D.DATE OF BIRTH:  1935/09/28  DATE OF PROCEDURE: DATE OF DISCHARGE:                                PROGRESS NOTE   SUBJECTIVE:  The patient was feverish and just not feeling well 2 days ago.  Blood tests at that time showed a white cell count 17,000.  A chest x-ray showed COPD and the urine was negative.  She was given a shot of Rocephin 1 gram IM.  Yesterday she felt better, and in fact took physical therapy twice.  She still is tired, however.  She has no complaint of fever and nursing confirms she has run no fever.  The patient says she does have some burning in her  abdomen, particularly the upper abdomen after eating but this is really about normal for her and is not a new finding.  OBJECTIVE:  GENERAL:  Today she is sitting up sitting up in bed. VITAL SIGNS:  Temperature is 98.5, pulse 95, respiratory rate 20, blood pressure 110/57. HEENT:  She is oriented, alert and in no acute distress.  She has cushingoid facies. LUNGS:  Show occasional rhonchi and wheezes with slightly prolonged expirations, throughout, as is normal for her. HEART:  Rate is about 80 sitting. EXTREMITIES:  There is no edema of the ankles. ABDOMEN:  There is abdominal obesity but no tenderness on palpation.  ASSESSMENT: 1. Febrile episode with elevated white cell count, question etiology. 2. Recent urinary tract infection. 3. Chronic obstructive pulmonary disease. 4. Status post hip fracture now getting physical therapy at the     nursing facility.  PLAN:  Urine cultures pending.  Continue with Rocephin one gram IM daily for 7 days.  Abdominal ultrasound has been ordered.  Discussed with nursing.     Mila Homer. [name], M.D.     SDK/MEDQ  D:  01/17/2011  T:  01/17/2011  Job:   [id number]  Electronically Signed by John Giovanni M.D. on 01/24/2011 03:37:27 AM

## 2011-01-25 LAB — GLUCOSE, CAPILLARY
Glucose-Capillary: 161 mg/dL — ABNORMAL HIGH (ref 70–99)
Glucose-Capillary: 150 mg/dL — ABNORMAL HIGH (ref 70–99)

## 2011-01-26 LAB — GLUCOSE, CAPILLARY
Glucose-Capillary: 232 mg/dL — ABNORMAL HIGH (ref 70–99)
Glucose-Capillary: 77 mg/dL (ref 70–99)

## 2011-01-27 LAB — GLUCOSE, CAPILLARY
Glucose-Capillary: 140 mg/dL — ABNORMAL HIGH (ref 70–99)
Glucose-Capillary: 181 mg/dL — ABNORMAL HIGH (ref 70–99)
Glucose-Capillary: 129 mg/dL — ABNORMAL HIGH (ref 70–99)

## 2011-01-28 LAB — GLUCOSE, CAPILLARY
Glucose-Capillary: 127 mg/dL — ABNORMAL HIGH (ref 70–99)
Glucose-Capillary: 149 mg/dL — ABNORMAL HIGH (ref 70–99)

## 2011-01-29 ENCOUNTER — Ambulatory Visit (HOSPITAL_COMMUNITY): Payer: Medicare Other | Attending: Family Medicine

## 2011-01-29 DIAGNOSIS — I700 Atherosclerosis of aorta: Secondary | ICD-10-CM | POA: Insufficient documentation

## 2011-01-29 DIAGNOSIS — I708 Atherosclerosis of other arteries: Secondary | ICD-10-CM | POA: Insufficient documentation

## 2011-01-29 DIAGNOSIS — K5730 Diverticulosis of large intestine without perforation or abscess without bleeding: Secondary | ICD-10-CM | POA: Insufficient documentation

## 2011-01-29 LAB — GLUCOSE, CAPILLARY: Glucose-Capillary: 98 mg/dL (ref 70–99)

## 2011-01-29 MED ORDER — IOHEXOL 300 MG/ML  SOLN
100.0000 mL | Freq: Once | INTRAMUSCULAR | Status: AC | PRN
  Administered 2011-01-29: 100 mL via INTRAVENOUS

## 2011-01-30 LAB — GLUCOSE, CAPILLARY
Glucose-Capillary: 111 mg/dL — ABNORMAL HIGH (ref 70–99)
Glucose-Capillary: 155 mg/dL — ABNORMAL HIGH (ref 70–99)
Glucose-Capillary: 122 mg/dL — ABNORMAL HIGH (ref 70–99)

## 2011-01-31 LAB — GLUCOSE, CAPILLARY
Glucose-Capillary: 173 mg/dL — ABNORMAL HIGH (ref 70–99)
Glucose-Capillary: 119 mg/dL — ABNORMAL HIGH (ref 70–99)

## 2011-02-01 LAB — GLUCOSE, CAPILLARY
Glucose-Capillary: 135 mg/dL — ABNORMAL HIGH (ref 70–99)
Glucose-Capillary: 195 mg/dL — ABNORMAL HIGH (ref 70–99)

## 2011-02-02 LAB — GLUCOSE, CAPILLARY
Glucose-Capillary: 128 mg/dL — ABNORMAL HIGH (ref 70–99)
Glucose-Capillary: 101 mg/dL — ABNORMAL HIGH (ref 70–99)
Glucose-Capillary: 170 mg/dL — ABNORMAL HIGH (ref 70–99)

## 2011-02-02 NOTE — Progress Notes (Signed)
  [name], [name]                 ACCOUNT NO.:  [id number]  MEDICAL RECORD NO.:  [id number]           PATIENT TYPE:  LOCATION:                                 FACILITY:  PHYSICIAN:  Mila Homer. [name], M.D.DATE OF BIRTH:  06-21-1935  DATE OF PROCEDURE: DATE OF DISCHARGE:                                PROGRESS NOTE   SUBJECTIVE:  Generally she is doing well, but she has a lot of pain in the left eye and she really cannot see out of the eye.  Her breathing is about normal for her.  OBJECTIVE:  GENERAL:  She is sitting up in bed.  She is oriented and alert.  She has cushingoid facies. LUNGS:  Show decreased breath sounds throughout with expiratory wheezing and occasional rhonchi.  She has no use of intercostal muscles and no use of accessory muscles of respiration. HEART:  Sounds are faint. HEENT:  The left eye still shows blood in the sclera, but the swelling looks like it is better.  She has normal range of motion of the eye. She tells me she just cannot see out of the eye.  The nursing facility tried to get her to see an ophthalmologist yesterday and it arranged transportation and arranged for an ophthalmologist to see here, but felt that she was sometimes confused and needed a family member to go with her, but no family members were able to do that.  Therefore she has never seen by an ophthalmologist. She also had ultrasound of her abdomen given the urinary tract infection and this showed calcifications in the aorta, but also a question about a lesion in the pancreatic tail and it was recommended that she get a CT to further evaluate that.  ASSESSMENT: 1. Decreased vision in the left eye, felt to be secondary to a scleral     hematoma.  I have not ruled out glaucoma or infection at this     point. 2. Chronic obstructive pulmonary disease. 3. Chronic steroid use. 4. Status post hip fracture, but doing well with physical therapy.  PLAN:  I talked to her personal nurse  again today and she will try to get the patient seen by Ophthalmology Monday, 2 days from now.  I think the patient is stable to go by herself with [facility] transportation without a family member since the confusion just seems to mainly act up with infection. We will also need to get a CT of her abdomen, which can be done later since the eye takes priority.  Will continue with the physical therapy twice a day to build up her strength and hopefully get her home in the near future.     Mila Homer. [name], M.D.     SDK/MEDQ  D:  01/26/2011  T:  01/26/2011  Job:  [id number]  Electronically Signed by John Giovanni M.D. on 02/02/2011 05:19:14 AM

## 2011-02-02 NOTE — Progress Notes (Signed)
  NAMEMARASIA, [name]                 ACCOUNT NO.:  [id number]  MEDICAL RECORD NO.:  [id number]           PATIENT TYPE:  O  LOCATION:  XRAY                          FACILITY:  [facility]  PHYSICIAN:  Mila Homer. [name], M.D.DATE OF BIRTH:  November 07, 1935  DATE OF PROCEDURE: DATE OF DISCHARGE:  01/18/2011                                PROGRESS NOTE   SUBJECTIVE:  This 76 year old resident of the [name] nursing facility developed what looked like scleral hematoma on the left about a week or so ago.  She has some edema and generalized blood behind the sclera.  Since then the patient has had fairly severe pain in the eye with no real drainage.  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  OBJECTIVE:  There is still erythema and edema in the sclera of the eye. Her vision is somewhat diminished.  She has a good deal pain here. There is no obvious discharge.  ASSESSMENT: 1. Scleral hematoma. 2. 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 3. Rule out glaucoma.  PLAN:  I have talked to her personal nurse.  She is going to get a culture on the eye and will start her on doxycycline 100 mg b.i.d. for least a 2-week course.  We are having her seen by ophthalmologist to confirm the diagnosis and to make sure she does not have glaucoma as a cause of this swelling.     Mila Homer. [name], M.D.     SDK/MEDQ  D:  01/23/2011  T:  01/23/2011  Job:  [id number]  Electronically Signed by John Giovanni M.D. on 02/02/2011 05:18:29 AM

## 2011-02-03 LAB — GLUCOSE, CAPILLARY
Glucose-Capillary: 174 mg/dL — ABNORMAL HIGH (ref 70–99)
Glucose-Capillary: 72 mg/dL (ref 70–99)

## 2011-02-04 LAB — GLUCOSE, CAPILLARY
Glucose-Capillary: 125 mg/dL — ABNORMAL HIGH (ref 70–99)
Glucose-Capillary: 162 mg/dL — ABNORMAL HIGH (ref 70–99)

## 2011-02-05 LAB — GLUCOSE, CAPILLARY

## 2011-02-06 LAB — GLUCOSE, CAPILLARY
Glucose-Capillary: 145 mg/dL — ABNORMAL HIGH (ref 70–99)
Glucose-Capillary: 172 mg/dL — ABNORMAL HIGH (ref 70–99)

## 2011-02-07 LAB — GLUCOSE, CAPILLARY
Glucose-Capillary: 153 mg/dL — ABNORMAL HIGH (ref 70–99)
Glucose-Capillary: 165 mg/dL — ABNORMAL HIGH (ref 70–99)

## 2011-02-08 LAB — GLUCOSE, CAPILLARY
Glucose-Capillary: 109 mg/dL — ABNORMAL HIGH (ref 70–99)
Glucose-Capillary: 140 mg/dL — ABNORMAL HIGH (ref 70–99)

## 2011-02-09 LAB — GLUCOSE, CAPILLARY: Glucose-Capillary: 105 mg/dL — ABNORMAL HIGH (ref 70–99)

## 2011-02-10 LAB — GLUCOSE, CAPILLARY
Glucose-Capillary: 118 mg/dL — ABNORMAL HIGH (ref 70–99)
Glucose-Capillary: 135 mg/dL — ABNORMAL HIGH (ref 70–99)

## 2011-02-11 LAB — GLUCOSE, CAPILLARY
Glucose-Capillary: 113 mg/dL — ABNORMAL HIGH (ref 70–99)
Glucose-Capillary: 131 mg/dL — ABNORMAL HIGH (ref 70–99)

## 2011-02-12 LAB — GLUCOSE, CAPILLARY: Glucose-Capillary: 99 mg/dL (ref 70–99)

## 2011-02-13 LAB — GLUCOSE, CAPILLARY: Glucose-Capillary: 119 mg/dL — ABNORMAL HIGH (ref 70–99)

## 2011-02-14 LAB — GLUCOSE, CAPILLARY: Glucose-Capillary: 154 mg/dL — ABNORMAL HIGH (ref 70–99)

## 2011-02-16 LAB — GLUCOSE, CAPILLARY
Glucose-Capillary: 177 mg/dL — ABNORMAL HIGH (ref 70–99)
Glucose-Capillary: 158 mg/dL — ABNORMAL HIGH (ref 70–99)

## 2011-02-17 LAB — GLUCOSE, CAPILLARY: Glucose-Capillary: 120 mg/dL — ABNORMAL HIGH (ref 70–99)

## 2011-02-18 LAB — GLUCOSE, CAPILLARY
Glucose-Capillary: 110 mg/dL — ABNORMAL HIGH (ref 70–99)
Glucose-Capillary: 114 mg/dL — ABNORMAL HIGH (ref 70–99)
Glucose-Capillary: 145 mg/dL — ABNORMAL HIGH (ref 70–99)

## 2011-02-19 LAB — GLUCOSE, CAPILLARY
Glucose-Capillary: 170 mg/dL — ABNORMAL HIGH (ref 70–99)
Glucose-Capillary: 138 mg/dL — ABNORMAL HIGH (ref 70–99)
Glucose-Capillary: 150 mg/dL — ABNORMAL HIGH (ref 70–99)

## 2011-02-20 LAB — GLUCOSE, CAPILLARY: Glucose-Capillary: 159 mg/dL — ABNORMAL HIGH (ref 70–99)

## 2011-02-21 LAB — GLUCOSE, CAPILLARY
Glucose-Capillary: 189 mg/dL — ABNORMAL HIGH (ref 70–99)
Glucose-Capillary: 80 mg/dL (ref 70–99)

## 2011-02-22 LAB — GLUCOSE, CAPILLARY
Glucose-Capillary: 116 mg/dL — ABNORMAL HIGH (ref 70–99)
Glucose-Capillary: 179 mg/dL — ABNORMAL HIGH (ref 70–99)
Glucose-Capillary: 92 mg/dL (ref 70–99)
Glucose-Capillary: 154 mg/dL — ABNORMAL HIGH (ref 70–99)

## 2011-02-23 LAB — GLUCOSE, CAPILLARY
Glucose-Capillary: 113 mg/dL — ABNORMAL HIGH (ref 70–99)
Glucose-Capillary: 129 mg/dL — ABNORMAL HIGH (ref 70–99)

## 2011-02-24 LAB — GLUCOSE, CAPILLARY
Glucose-Capillary: 160 mg/dL — ABNORMAL HIGH (ref 70–99)
Glucose-Capillary: 98 mg/dL (ref 70–99)
Glucose-Capillary: 172 mg/dL — ABNORMAL HIGH (ref 70–99)

## 2011-02-25 LAB — GLUCOSE, CAPILLARY
Comment 1: 8143056
Glucose-Capillary: 128 mg/dL — ABNORMAL HIGH (ref 70–99)
Glucose-Capillary: 109 mg/dL — ABNORMAL HIGH (ref 70–99)
Glucose-Capillary: 111 mg/dL — ABNORMAL HIGH (ref 70–99)
Glucose-Capillary: 115 mg/dL — ABNORMAL HIGH (ref 70–99)
Glucose-Capillary: 115 mg/dL — ABNORMAL HIGH (ref 70–99)
Glucose-Capillary: 117 mg/dL — ABNORMAL HIGH (ref 70–99)
Glucose-Capillary: 120 mg/dL — ABNORMAL HIGH (ref 70–99)
Glucose-Capillary: 120 mg/dL — ABNORMAL HIGH (ref 70–99)
Glucose-Capillary: 129 mg/dL — ABNORMAL HIGH (ref 70–99)
Glucose-Capillary: 134 mg/dL — ABNORMAL HIGH (ref 70–99)
Glucose-Capillary: 138 mg/dL — ABNORMAL HIGH (ref 70–99)
Glucose-Capillary: 147 mg/dL — ABNORMAL HIGH (ref 70–99)
Glucose-Capillary: 150 mg/dL — ABNORMAL HIGH (ref 70–99)
Glucose-Capillary: 158 mg/dL — ABNORMAL HIGH (ref 70–99)
Glucose-Capillary: 160 mg/dL — ABNORMAL HIGH (ref 70–99)
Glucose-Capillary: 173 mg/dL — ABNORMAL HIGH (ref 70–99)
Glucose-Capillary: 174 mg/dL — ABNORMAL HIGH (ref 70–99)
Glucose-Capillary: 175 mg/dL — ABNORMAL HIGH (ref 70–99)
Glucose-Capillary: 178 mg/dL — ABNORMAL HIGH (ref 70–99)
Glucose-Capillary: 183 mg/dL — ABNORMAL HIGH (ref 70–99)
Glucose-Capillary: 192 mg/dL — ABNORMAL HIGH (ref 70–99)
Glucose-Capillary: 192 mg/dL — ABNORMAL HIGH (ref 70–99)
Glucose-Capillary: 194 mg/dL — ABNORMAL HIGH (ref 70–99)
Glucose-Capillary: 205 mg/dL — ABNORMAL HIGH (ref 70–99)
Glucose-Capillary: 213 mg/dL — ABNORMAL HIGH (ref 70–99)
Glucose-Capillary: 215 mg/dL — ABNORMAL HIGH (ref 70–99)
Glucose-Capillary: 254 mg/dL — ABNORMAL HIGH (ref 70–99)
Glucose-Capillary: 258 mg/dL — ABNORMAL HIGH (ref 70–99)
Glucose-Capillary: 261 mg/dL — ABNORMAL HIGH (ref 70–99)
Glucose-Capillary: 277 mg/dL — ABNORMAL HIGH (ref 70–99)
Glucose-Capillary: 282 mg/dL — ABNORMAL HIGH (ref 70–99)
Glucose-Capillary: 300 mg/dL — ABNORMAL HIGH (ref 70–99)
Glucose-Capillary: 306 mg/dL — ABNORMAL HIGH (ref 70–99)
Glucose-Capillary: 352 mg/dL — ABNORMAL HIGH (ref 70–99)
Glucose-Capillary: 387 mg/dL — ABNORMAL HIGH (ref 70–99)
Glucose-Capillary: 161 mg/dL — ABNORMAL HIGH (ref 70–99)
Glucose-Capillary: 101 mg/dL — ABNORMAL HIGH (ref 70–99)
Glucose-Capillary: 104 mg/dL — ABNORMAL HIGH (ref 70–99)
Glucose-Capillary: 106 mg/dL — ABNORMAL HIGH (ref 70–99)
Glucose-Capillary: 106 mg/dL — ABNORMAL HIGH (ref 70–99)
Glucose-Capillary: 113 mg/dL — ABNORMAL HIGH (ref 70–99)
Glucose-Capillary: 126 mg/dL — ABNORMAL HIGH (ref 70–99)
Glucose-Capillary: 135 mg/dL — ABNORMAL HIGH (ref 70–99)
Glucose-Capillary: 136 mg/dL — ABNORMAL HIGH (ref 70–99)
Glucose-Capillary: 137 mg/dL — ABNORMAL HIGH (ref 70–99)
Glucose-Capillary: 142 mg/dL — ABNORMAL HIGH (ref 70–99)
Glucose-Capillary: 150 mg/dL — ABNORMAL HIGH (ref 70–99)
Glucose-Capillary: 151 mg/dL — ABNORMAL HIGH (ref 70–99)
Glucose-Capillary: 154 mg/dL — ABNORMAL HIGH (ref 70–99)
Glucose-Capillary: 161 mg/dL — ABNORMAL HIGH (ref 70–99)
Glucose-Capillary: 164 mg/dL — ABNORMAL HIGH (ref 70–99)
Glucose-Capillary: 170 mg/dL — ABNORMAL HIGH (ref 70–99)
Glucose-Capillary: 178 mg/dL — ABNORMAL HIGH (ref 70–99)
Glucose-Capillary: 178 mg/dL — ABNORMAL HIGH (ref 70–99)
Glucose-Capillary: 181 mg/dL — ABNORMAL HIGH (ref 70–99)
Glucose-Capillary: 190 mg/dL — ABNORMAL HIGH (ref 70–99)
Glucose-Capillary: 194 mg/dL — ABNORMAL HIGH (ref 70–99)
Glucose-Capillary: 205 mg/dL — ABNORMAL HIGH (ref 70–99)
Glucose-Capillary: 207 mg/dL — ABNORMAL HIGH (ref 70–99)
Glucose-Capillary: 209 mg/dL — ABNORMAL HIGH (ref 70–99)
Glucose-Capillary: 210 mg/dL — ABNORMAL HIGH (ref 70–99)
Glucose-Capillary: 218 mg/dL — ABNORMAL HIGH (ref 70–99)
Glucose-Capillary: 220 mg/dL — ABNORMAL HIGH (ref 70–99)
Glucose-Capillary: 229 mg/dL — ABNORMAL HIGH (ref 70–99)
Glucose-Capillary: 233 mg/dL — ABNORMAL HIGH (ref 70–99)
Glucose-Capillary: 255 mg/dL — ABNORMAL HIGH (ref 70–99)
Glucose-Capillary: 93 mg/dL (ref 70–99)
Glucose-Capillary: 98 mg/dL (ref 70–99)

## 2011-02-25 LAB — DIFFERENTIAL
Basophils Absolute: 0 10*3/uL (ref 0.0–0.1)
Basophils Relative: 0 % (ref 0–1)
Eosinophils Relative: 2 % (ref 0–5)
Lymphocytes Relative: 10 % — ABNORMAL LOW (ref 12–46)
Lymphocytes Relative: 11 % — ABNORMAL LOW (ref 12–46)
Monocytes Absolute: 1.4 10*3/uL — ABNORMAL HIGH (ref 0.1–1.0)
Monocytes Relative: 14 % — ABNORMAL HIGH (ref 3–12)
Neutro Abs: 6.9 10*3/uL (ref 1.7–7.7)
Neutro Abs: 7.1 10*3/uL (ref 1.7–7.7)

## 2011-02-25 LAB — PROTIME-INR: INR: 0.97 (ref 0.00–1.49)

## 2011-02-25 LAB — CBC
MCV: 96.8 fL (ref 78.0–100.0)
MCV: 97.7 fL (ref 78.0–100.0)
Platelets: 210 10*3/uL (ref 150–400)
Platelets: 217 10*3/uL (ref 150–400)
RDW: 14.6 % (ref 11.5–15.5)
RDW: 15 % (ref 11.5–15.5)
WBC: 9.7 10*3/uL (ref 4.0–10.5)
WBC: 9.7 10*3/uL (ref 4.0–10.5)

## 2011-02-25 LAB — URINE CULTURE: Culture  Setup Time: 201112211125

## 2011-02-25 LAB — APTT: aPTT: 28 seconds (ref 24–37)

## 2011-02-25 LAB — BASIC METABOLIC PANEL
BUN: 13 mg/dL (ref 6–23)
Creatinine, Ser: 0.86 mg/dL (ref 0.4–1.2)
GFR calc non Af Amer: >60 mL/min (ref >60)

## 2011-02-25 LAB — COMPREHENSIVE METABOLIC PANEL
Albumin: 3.6 g/dL (ref 3.5–5.2)
Alkaline Phosphatase: 93 U/L (ref 39–117)
BUN: 13 mg/dL (ref 6–23)
GFR calc Af Amer: >60 mL/min (ref >60)
Potassium: 3.9 mEq/L (ref 3.5–5.1)
Total Protein: 6.2 g/dL (ref 6.0–8.3)

## 2011-02-25 LAB — URINALYSIS, ROUTINE W REFLEX MICROSCOPIC
Bilirubin Urine: NEGATIVE
Ketones, ur: NEGATIVE mg/dL
Nitrite: NEGATIVE
Protein, ur: NEGATIVE mg/dL
Urobilinogen, UA: 0.2 mg/dL (ref 0.0–1.0)

## 2011-02-25 LAB — VANCOMYCIN, TROUGH: Vancomycin Tr: 14.5 ug/mL (ref 10.0–20.0)

## 2011-02-25 LAB — CULTURE, BLOOD (ROUTINE X 2): Culture: NO GROWTH

## 2011-02-25 LAB — MRSA PCR SCREENING: MRSA by PCR: POSITIVE — AB

## 2011-02-26 LAB — BLOOD GAS, ARTERIAL
FIO2: 30 %
O2 Content: 2 L/min
O2 Content: 3 L/min
O2 Content: 30 L/min
O2 Content: 4.5 L/min
O2 Saturation: 88.4 %
O2 Saturation: 94.8 %
Patient temperature: 37
RATE: 14 resp/min
TCO2: 32.2 mmol/L (ref 0–100)
pCO2 arterial: 45.9 mmHg — ABNORMAL HIGH (ref 35.0–45.0)
pCO2 arterial: 54 mmHg — ABNORMAL HIGH (ref 35.0–45.0)
pH, Arterial: 7.291 — ABNORMAL LOW (ref 7.350–7.400)
pO2, Arterial: 53.1 mmHg — ABNORMAL LOW (ref 80.0–100.0)
pO2, Arterial: 64.7 mmHg — ABNORMAL LOW (ref 80.0–100.0)

## 2011-02-26 LAB — GLUCOSE, CAPILLARY
Glucose-Capillary: 121 mg/dL — ABNORMAL HIGH (ref 70–99)
Glucose-Capillary: 102 mg/dL — ABNORMAL HIGH (ref 70–99)
Glucose-Capillary: 107 mg/dL — ABNORMAL HIGH (ref 70–99)
Glucose-Capillary: 107 mg/dL — ABNORMAL HIGH (ref 70–99)
Glucose-Capillary: 109 mg/dL — ABNORMAL HIGH (ref 70–99)
Glucose-Capillary: 110 mg/dL — ABNORMAL HIGH (ref 70–99)
Glucose-Capillary: 110 mg/dL — ABNORMAL HIGH (ref 70–99)
Glucose-Capillary: 113 mg/dL — ABNORMAL HIGH (ref 70–99)
Glucose-Capillary: 115 mg/dL — ABNORMAL HIGH (ref 70–99)
Glucose-Capillary: 118 mg/dL — ABNORMAL HIGH (ref 70–99)
Glucose-Capillary: 148 mg/dL — ABNORMAL HIGH (ref 70–99)
Glucose-Capillary: 150 mg/dL — ABNORMAL HIGH (ref 70–99)
Glucose-Capillary: 150 mg/dL — ABNORMAL HIGH (ref 70–99)
Glucose-Capillary: 171 mg/dL — ABNORMAL HIGH (ref 70–99)
Glucose-Capillary: 178 mg/dL — ABNORMAL HIGH (ref 70–99)
Glucose-Capillary: 180 mg/dL — ABNORMAL HIGH (ref 70–99)
Glucose-Capillary: 186 mg/dL — ABNORMAL HIGH (ref 70–99)
Glucose-Capillary: 195 mg/dL — ABNORMAL HIGH (ref 70–99)
Glucose-Capillary: 200 mg/dL — ABNORMAL HIGH (ref 70–99)
Glucose-Capillary: 200 mg/dL — ABNORMAL HIGH (ref 70–99)
Glucose-Capillary: 205 mg/dL — ABNORMAL HIGH (ref 70–99)
Glucose-Capillary: 206 mg/dL — ABNORMAL HIGH (ref 70–99)
Glucose-Capillary: 208 mg/dL — ABNORMAL HIGH (ref 70–99)
Glucose-Capillary: 211 mg/dL — ABNORMAL HIGH (ref 70–99)
Glucose-Capillary: 212 mg/dL — ABNORMAL HIGH (ref 70–99)
Glucose-Capillary: 212 mg/dL — ABNORMAL HIGH (ref 70–99)
Glucose-Capillary: 218 mg/dL — ABNORMAL HIGH (ref 70–99)
Glucose-Capillary: 219 mg/dL — ABNORMAL HIGH (ref 70–99)
Glucose-Capillary: 224 mg/dL — ABNORMAL HIGH (ref 70–99)
Glucose-Capillary: 225 mg/dL — ABNORMAL HIGH (ref 70–99)
Glucose-Capillary: 228 mg/dL — ABNORMAL HIGH (ref 70–99)
Glucose-Capillary: 228 mg/dL — ABNORMAL HIGH (ref 70–99)
Glucose-Capillary: 232 mg/dL — ABNORMAL HIGH (ref 70–99)
Glucose-Capillary: 237 mg/dL — ABNORMAL HIGH (ref 70–99)
Glucose-Capillary: 242 mg/dL — ABNORMAL HIGH (ref 70–99)
Glucose-Capillary: 247 mg/dL — ABNORMAL HIGH (ref 70–99)
Glucose-Capillary: 250 mg/dL — ABNORMAL HIGH (ref 70–99)
Glucose-Capillary: 253 mg/dL — ABNORMAL HIGH (ref 70–99)
Glucose-Capillary: 258 mg/dL — ABNORMAL HIGH (ref 70–99)
Glucose-Capillary: 260 mg/dL — ABNORMAL HIGH (ref 70–99)
Glucose-Capillary: 263 mg/dL — ABNORMAL HIGH (ref 70–99)
Glucose-Capillary: 268 mg/dL — ABNORMAL HIGH (ref 70–99)
Glucose-Capillary: 273 mg/dL — ABNORMAL HIGH (ref 70–99)
Glucose-Capillary: 274 mg/dL — ABNORMAL HIGH (ref 70–99)
Glucose-Capillary: 303 mg/dL — ABNORMAL HIGH (ref 70–99)
Glucose-Capillary: 146 mg/dL — ABNORMAL HIGH (ref 70–99)
Glucose-Capillary: 107 mg/dL — ABNORMAL HIGH (ref 70–99)

## 2011-02-26 LAB — ABO/RH: ABO/RH(D): A NEG

## 2011-02-26 LAB — CBC
HCT: 31.6 % — ABNORMAL LOW (ref 36.0–46.0)
HCT: 32 % — ABNORMAL LOW (ref 36.0–46.0)
Hemoglobin: 10.1 g/dL — ABNORMAL LOW (ref 12.0–15.0)
Hemoglobin: 10.8 g/dL — ABNORMAL LOW (ref 12.0–15.0)
Hemoglobin: 10.8 g/dL — ABNORMAL LOW (ref 12.0–15.0)
Hemoglobin: 9.6 g/dL — ABNORMAL LOW (ref 12.0–15.0)
MCH: 30.8 pg (ref 26.0–34.0)
MCH: 30.9 pg (ref 26.0–34.0)
MCHC: 33.1 g/dL (ref 30.0–36.0)
MCHC: 33.2 g/dL (ref 30.0–36.0)
MCHC: 34.1 g/dL (ref 30.0–36.0)
MCV: 92.3 fL (ref 78.0–100.0)
Platelets: 146 10*3/uL — ABNORMAL LOW (ref 150–400)
Platelets: 164 10*3/uL (ref 150–400)
RBC: 3.11 MIL/uL — ABNORMAL LOW (ref 3.87–5.11)
RBC: 3.41 MIL/uL — ABNORMAL LOW (ref 3.87–5.11)
RBC: 3.46 MIL/uL — ABNORMAL LOW (ref 3.87–5.11)
RDW: 13.4 % (ref 11.5–15.5)
RDW: 13.6 % (ref 11.5–15.5)
RDW: 14 % (ref 11.5–15.5)
WBC: 12.4 10*3/uL — ABNORMAL HIGH (ref 4.0–10.5)
WBC: 21 10*3/uL — ABNORMAL HIGH (ref 4.0–10.5)
WBC: 7.1 10*3/uL (ref 4.0–10.5)

## 2011-02-26 LAB — BASIC METABOLIC PANEL
BUN: 17 mg/dL (ref 6–23)
BUN: 7 mg/dL (ref 6–23)
CO2: 33 mEq/L — ABNORMAL HIGH (ref 19–32)
CO2: 35 mEq/L — ABNORMAL HIGH (ref 19–32)
CO2: 35 mEq/L — ABNORMAL HIGH (ref 19–32)
Calcium: 8.3 mg/dL — ABNORMAL LOW (ref 8.4–10.5)
Calcium: 8.3 mg/dL — ABNORMAL LOW (ref 8.4–10.5)
Calcium: 8.6 mg/dL (ref 8.4–10.5)
Calcium: 9 mg/dL (ref 8.4–10.5)
Chloride: 98 mEq/L (ref 96–112)
Creatinine, Ser: 0.9 mg/dL (ref 0.4–1.2)
GFR calc Af Amer: >60 mL/min (ref >60)
GFR calc Af Amer: >60 mL/min (ref >60)
GFR calc Af Amer: >60 mL/min (ref >60)
GFR calc non Af Amer: >60 mL/min (ref >60)
GFR calc non Af Amer: >60 mL/min (ref >60)
GFR calc non Af Amer: >60 mL/min (ref >60)
GFR calc non Af Amer: >60 mL/min (ref >60)
GFR calc non Af Amer: >60 mL/min (ref >60)
Glucose, Bld: 148 mg/dL — ABNORMAL HIGH (ref 70–99)
Glucose, Bld: 235 mg/dL — ABNORMAL HIGH (ref 70–99)
Glucose, Bld: 239 mg/dL — ABNORMAL HIGH (ref 70–99)
Potassium: 3.8 mEq/L (ref 3.5–5.1)
Potassium: 3.8 mEq/L (ref 3.5–5.1)
Potassium: 3.8 mEq/L (ref 3.5–5.1)
Sodium: 136 mEq/L (ref 135–145)
Sodium: 139 mEq/L (ref 135–145)
Sodium: 140 mEq/L (ref 135–145)
Sodium: 142 mEq/L (ref 135–145)

## 2011-02-26 LAB — DIFFERENTIAL
Basophils Absolute: 0 10*3/uL (ref 0.0–0.1)
Basophils Absolute: 0 10*3/uL (ref 0.0–0.1)
Basophils Absolute: 0 10*3/uL (ref 0.0–0.1)
Basophils Absolute: 0.1 10*3/uL (ref 0.0–0.1)
Basophils Relative: 0 % (ref 0–1)
Basophils Relative: 1 % (ref 0–1)
Eosinophils Absolute: 0 10*3/uL (ref 0.0–0.7)
Eosinophils Relative: 0 % (ref 0–5)
Lymphocytes Relative: 2 % — ABNORMAL LOW (ref 12–46)
Lymphocytes Relative: 6 % — ABNORMAL LOW (ref 12–46)
Lymphs Abs: 0.5 10*3/uL — ABNORMAL LOW (ref 0.7–4.0)
Lymphs Abs: 0.5 10*3/uL — ABNORMAL LOW (ref 0.7–4.0)
Lymphs Abs: 0.7 10*3/uL (ref 0.7–4.0)
Monocytes Absolute: 0.7 10*3/uL (ref 0.1–1.0)
Monocytes Absolute: 1.3 10*3/uL — ABNORMAL HIGH (ref 0.1–1.0)
Monocytes Relative: 5 % (ref 3–12)
Monocytes Relative: 5 % (ref 3–12)
Monocytes Relative: 6 % (ref 3–12)
Neutro Abs: 11.6 10*3/uL — ABNORMAL HIGH (ref 1.7–7.7)
Neutro Abs: 19.2 10*3/uL — ABNORMAL HIGH (ref 1.7–7.7)
Neutro Abs: 6.5 10*3/uL (ref 1.7–7.7)
Neutro Abs: 9.9 10*3/uL — ABNORMAL HIGH (ref 1.7–7.7)
Neutro Abs: 9.9 10*3/uL — ABNORMAL HIGH (ref 1.7–7.7)
Neutrophils Relative %: 80 % — ABNORMAL HIGH (ref 43–77)
Neutrophils Relative %: 88 % — ABNORMAL HIGH (ref 43–77)
Neutrophils Relative %: 89 % — ABNORMAL HIGH (ref 43–77)
Neutrophils Relative %: 91 % — ABNORMAL HIGH (ref 43–77)
Neutrophils Relative %: 93 % — ABNORMAL HIGH (ref 43–77)

## 2011-02-26 LAB — PROTIME-INR
INR: 0.99 (ref 0.00–1.49)
Prothrombin Time: 13.3 seconds (ref 11.6–15.2)

## 2011-02-26 LAB — CROSSMATCH
PT AG Type: NEGATIVE
Unit division: 0

## 2011-02-27 LAB — GLUCOSE, CAPILLARY
Glucose-Capillary: 122 mg/dL — ABNORMAL HIGH (ref 70–99)
Glucose-Capillary: 148 mg/dL — ABNORMAL HIGH (ref 70–99)
Glucose-Capillary: 136 mg/dL — ABNORMAL HIGH (ref 70–99)

## 2011-02-28 LAB — GLUCOSE, CAPILLARY

## 2011-03-04 LAB — GLUCOSE, CAPILLARY: Glucose-Capillary: 85 mg/dL (ref 70–99)

## 2011-03-06 LAB — GLUCOSE, CAPILLARY: Glucose-Capillary: 101 mg/dL — ABNORMAL HIGH (ref 70–99)

## 2011-03-08 LAB — GLUCOSE, CAPILLARY: Glucose-Capillary: 161 mg/dL — ABNORMAL HIGH (ref 70–99)

## 2011-03-11 LAB — GLUCOSE, CAPILLARY: Glucose-Capillary: 156 mg/dL — ABNORMAL HIGH (ref 70–99)

## 2011-03-14 ENCOUNTER — Inpatient Hospital Stay
Admission: RE | Admit: 2011-03-14 | Discharge: 2011-03-25 | Disposition: A | Discharge status: Home / Self Care | Payer: Medicaid Other | Source: Ambulatory Visit | Attending: Family Medicine | Admitting: Family Medicine

## 2011-03-25 NOTE — Discharge Summary (Signed)
  [name], [name]                 ACCOUNT NO.:  [id number]  MEDICAL RECORD NO.:  [id number]           PATIENT TYPE:  I  LOCATION:  S145                          FACILITY:  [facility]  PHYSICIAN:  Mila Homer. [name], M.D.DATE OF BIRTH:  Apr 13, 1935  DATE OF ADMISSION:  03/14/2011 DATE OF DISCHARGE:  04/06/2012LH                              DISCHARGE SUMMARY   This 76 year old woman was admitted to the [facility] nursing facility, where she stayed for several months, recuperating from a intertrochanteric fracture of the right hip, which was incurred November 2011.  During the nursing home stay, she required hospitalization at [facility] for Proteus mirabilis urinary tract infection.  She also developed what turned out to be acute glaucoma of the left eye and eventually required surgery at [facility] for glaucoma.  She persisted with her physical therapy, gradually getting her leg to the point where she could walk and she was ready for discharge by early April 2012.  DISCHARGE DIAGNOSES INCLUDE: 1. Intertrochanteric fracture of the right hip, now doing well with     physical therapy. 2. Recent acute glaucoma attack, now stable on medication. 3. Status post removal of a cataract from the left eye, done March     2012. 4. Long-term use of steroids. 5. Chronic obstructive pulmonary disease. 6. Rheumatoid arthritis. 7. Reactive depression. 8. Benign essential hypertension. 9. History of tobacco use. 10.Chronic anxiety. 11.History of a cerebrovascular accident.  I reviewed her  medication list and talked to discharge planning.  The patient will need a wheelchair at home, as well as a walker, a shower chair, and follow-up with home health.  DISCHARGE MEDICATIONS: 1. She is to continue Cymbalta 60 mg daily, 2. Lotrel 10/20 daily, 3. Aspirin 81 mg daily, 4. Vitamin D 50,000 units q. week, 5. Simvastatin 40 mg daily, 6. Methotrexate 2.5 mg tablets 5  tablets a week. 7. Qvar 80 inhaler puff b.i.d., 8. Alphagan 0.1% 1 drop in the left eye b.i.d., 9. Azopt 1% eyedrops 1 drop in the left eye b.i.d., 10.O2 at 3-4 liters a minute. 11.Albuterol by nebulizer p.r.n..  The patient will get follow-up in my office within 1-2 weeks after discharge.     Mila Homer. [name], M.D.     SDK/MEDQ  D:  03/22/2011  T:  03/22/2011  Job:  [id number]  Electronically Signed by John Giovanni M.D. on 03/25/2011 05:31:03 AM

## 2011-04-02 LAB — BASIC METABOLIC PANEL
BUN: 7 mg/dL (ref 6–23)
BUN: 19 mg/dL (ref 6–23)
CO2: 34 mEq/L — ABNORMAL HIGH (ref 19–32)
Calcium: 9.4 mg/dL (ref 8.4–10.5)
Calcium: 9.4 mg/dL (ref 8.4–10.5)
Calcium: 9.5 mg/dL (ref 8.4–10.5)
Creatinine, Ser: 0.66 mg/dL (ref 0.4–1.2)
Creatinine, Ser: 0.73 mg/dL (ref 0.4–1.2)
GFR calc Af Amer: >60 mL/min (ref >60)
GFR calc non Af Amer: >60 mL/min (ref >60)
GFR calc non Af Amer: >60 mL/min (ref >60)
GFR calc non Af Amer: >60 mL/min (ref >60)
Glucose, Bld: 137 mg/dL — ABNORMAL HIGH (ref 70–99)
Glucose, Bld: 181 mg/dL — ABNORMAL HIGH (ref 70–99)
Potassium: 3.7 mEq/L (ref 3.5–5.1)
Potassium: 4.6 mEq/L (ref 3.5–5.1)
Sodium: 139 mEq/L (ref 135–145)

## 2011-04-02 LAB — DIFFERENTIAL
Basophils Absolute: 0 10*3/uL (ref 0.0–0.1)
Basophils Absolute: 0 10*3/uL (ref 0.0–0.1)
Basophils Relative: 0 % (ref 0–1)
Eosinophils Absolute: 0 10*3/uL (ref 0.0–0.7)
Eosinophils Relative: 0 % (ref 0–5)
Lymphocytes Relative: 14 % (ref 12–46)
Lymphocytes Relative: 3 % — ABNORMAL LOW (ref 12–46)
Lymphs Abs: 1.2 10*3/uL (ref 0.7–4.0)
Monocytes Absolute: 0.5 10*3/uL (ref 0.1–1.0)
Monocytes Absolute: 0.6 10*3/uL (ref 0.1–1.0)
Monocytes Relative: 3 % (ref 3–12)
Monocytes Relative: 4 % (ref 3–12)
Neutro Abs: 13.3 10*3/uL — ABNORMAL HIGH (ref 1.7–7.7)
Neutro Abs: 15.7 10*3/uL — ABNORMAL HIGH (ref 1.7–7.7)
Neutrophils Relative %: 70 % (ref 43–77)
Neutrophils Relative %: 88 % — ABNORMAL HIGH (ref 43–77)

## 2011-04-02 LAB — GLUCOSE, CAPILLARY
Glucose-Capillary: 180 mg/dL — ABNORMAL HIGH (ref 70–99)
Glucose-Capillary: 276 mg/dL — ABNORMAL HIGH (ref 70–99)
Glucose-Capillary: 109 mg/dL — ABNORMAL HIGH (ref 70–99)
Glucose-Capillary: 150 mg/dL — ABNORMAL HIGH (ref 70–99)
Glucose-Capillary: 154 mg/dL — ABNORMAL HIGH (ref 70–99)
Glucose-Capillary: 169 mg/dL — ABNORMAL HIGH (ref 70–99)
Glucose-Capillary: 174 mg/dL — ABNORMAL HIGH (ref 70–99)
Glucose-Capillary: 192 mg/dL — ABNORMAL HIGH (ref 70–99)
Glucose-Capillary: 218 mg/dL — ABNORMAL HIGH (ref 70–99)
Glucose-Capillary: 97 mg/dL (ref 70–99)

## 2011-04-02 LAB — CBC
HCT: 39.8 % (ref 36.0–46.0)
HCT: 40.8 % (ref 36.0–46.0)
Hemoglobin: 13.3 g/dL (ref 12.0–15.0)
Platelets: 208 10*3/uL (ref 150–400)
Platelets: 227 10*3/uL (ref 150–400)
RBC: 4.21 MIL/uL (ref 3.87–5.11)
RDW: 14.5 % (ref 11.5–15.5)
RDW: 14.2 % (ref 11.5–15.5)
RDW: 14.7 % (ref 11.5–15.5)
WBC: 9.1 10*3/uL (ref 4.0–10.5)
WBC: 15.2 10*3/uL — ABNORMAL HIGH (ref 4.0–10.5)

## 2011-04-30 NOTE — Discharge Summary (Signed)
NAMECORRY, STORIE                 ACCOUNT NO.:  [id number]   MEDICAL RECORD NO.:  [id number]          PATIENT TYPE:  INP   LOCATION:  A325                          FACILITY:  [facility]   PHYSICIAN:  Mila Homer. [name], M.D.DATE OF BIRTH:  1935-01-08   DATE OF ADMISSION:  11/13/2008  DATE OF DISCHARGE:  12/03/2009LH                               DISCHARGE SUMMARY   A 76 year old was admitted to the hospital with COPD exacerbation.  She  had a benign 4-day hospitalization extending from November 14, 2008 to  November 17, 2008.  Vital signs remained stable.   Her admission white cell count was 12,000, of which 74% were neutrophils  and 15 lymphs, hemoglobin 50.3, and platelet count 148,000.  Her  admission BMET showed a bicarb of 36.  Her BNP was less than 30 and  recheck white cell count third day, on IV steroids, was 15,000, of which  92% were neutrophils and 5 lymphocytes.  Her BMET the same day on  steroids showed bicarb 35 and glucose 142.   Her admission chest x-ray showed COPD.  She also had a borderline right  suprahilar soft tissue fullness felt to be probably a prominent  pulmonary artery.   She was admitted to the hospital, put on IV normal saline 100 mL an  hour, O2 at 3 liters a minute by nasal cannula, continuous pulse  oximetry, 2-g sodium diet, and Solu-Medrol 125 mg IV q.8 h. for 6 doses.  She was kept on alprazolam 0.5 mg daily p.r.n. anxiety, Vicodin 5/500  q.i.d. p.r.n. pain, and given a DuoNeb nebulizer q.4 h. while awake.   By her second day, Solu-Medrol was discontinued, and she was put on  prednisone 20 mg b.i.d. and switched to a Hep-Lock, but started on  Rocephin 1 g IV q.24 h. and Zithromax 5 IV q.24 h.   She did well on this regimen.  By her third day, she was improved, her  blood pressure was getting somewhat higher on the steroids and being off  her p.o. meds, so her Lotrel 10/20 daily was added back.  At the same  time, the furosemide 40 mg daily,  Pantoprazole 40 mg daily, methotrexate  2.5 mg 5 a week on Thursdays, and the simvastatin 40 mg daily.  She was  also given Cymbalta 30 mg daily for depression.   By her fourth day, November 17, 2008, she was much improved.  She was  ready for discharge home, and this was arranged on the following  medications;  1. Ceftin 500 mg b.i.d. for 7-day course (14, no refills).  2. Furosemide 40 mg daily.  3. Lotrel 5/20 daily.  4. Simvastatin 40 mg daily.  5. Pantoprazole 40 mg daily.  6. Methotrexate 2.5 mg 5 tablets once a week, on Thursdays.  7. Cymbalta 30 mg daily.  8. Alprazolam 0.5 mg every other day p.r.n. anxiety.  9. Vicodin 5/500 for pain.  10.She was also given O2 at 2-3 L by nasal cannula, as she has been      already.  She also has home nebulizer and handheld  inhalers to use.      Further, she is to use prednisone 10 mg two twice a day today and      tomorrow and then cut down to one twice a day after that with      followup in the office in 4 days.   FINAL DISCHARGE DIAGNOSES:  1. Chronic obstructive pulmonary disease exacerbation.  2. Right chest pain secondary to trauma and probably the chronic      obstructive pulmonary disease, improved at the time of discharge.  3. Benign essential hypertension.  4. Rheumatoid arthritis.  5. Hypercholesterolemia.  6. History of tobacco use.  7. History of cerebrovascular accident.  8. Reactive depression.  9. Reflux esophagitis status post Schatzki ring dilation.  10.Chronic anxiety.      Mila Homer. [name], M.D.  Electronically Signed     SDK/MEDQ  D:  11/17/2008  T:  11/17/2008  Job:  [id number]

## 2011-04-30 NOTE — Consult Note (Signed)
NAME:  CAIA, LOFARO                 ACCOUNT NO.:  [id number]   MEDICAL RECORD NO.:  [id number]          PATIENT TYPE:  INP   LOCATION:  A340                          FACILITY:  [facility]   PHYSICIAN:  R. Roetta Sessions, M.D. DATE OF BIRTH:  1935-11-30   DATE OF CONSULTATION:  DATE OF DISCHARGE:                                 CONSULTATION   REQUESTING PHYSICIAN:  Mila Homer. [name], MD   REASON FOR CONSULTATION:  Recurrent dysphagia.   HISTORY OF PRESENT ILLNESS:  Ms. Sparrow is a 76 year old Caucasian  female.  She has been admitted to [facility] with COPD  exacerbation and pneumonia.  She has had a 6-57-month history of  recurrent dysphagia.  She feels as though food gets stuck  retrosternally.  She is also having problems with liquids.  She has  history of hiatal hernia, esophageal stricture, and distal esophageal  erosions.  Stricture with last dilated by Dr. Jena Gauss during EGD on  August 20, 2005 with a 56-[facility] dilator.  She also notes  regurgitation of her food within minutes after eating, after she feels  it is lodged into her esophagus.  She denies any nausea or vomiting.  Denies any heartburn or indigestion.  She is on Protonix 40 mg daily.  She denies any diarrhea or constipation.  Denies any rectal bleeding or  melena.  Her weight has steadily increased since she has been on  prednisone.  She denies any anorexia or early satiety.   PAST MEDICAL AND SURGICAL HISTORY:  1. COPD on intermittent oxygen throughout the day and 3 liters via      nasal cannula at night.  2. She has history GERD with esophageal dysphagia and soft peptic      stricture.  3. She has history of CVA.  4. Hypertension.  5. COPD.  6. She is status post tubal ligation.  7. She has history of hiatal hernia.  8. Right eye surgery.  9. Depression.  10.Chronic anxiety.  11.Rheumatoid arthritis.  12.Hypercholesterolemia.   MEDICATIONS PRIOR TO ADMISSION:  1. Prednisone 10 mg daily.  2.  Furosemide 40 mg daily.  3. Cymbalta 30 mg daily.  4. Methotrexate 2.5 mg 5 tablets weekly on Thursday.  5. Simvastatin 40 mg daily.  6. Protonix 40 mg daily.   ALLERGIES:  No known drug allergies.   FAMILY HISTORY:  There is no known family history of colorectal  carcinoma, liver, or chronic GI problems.   SOCIAL HISTORY:  She is a widow.  She lives with her one of her  daughters and 2 grandchildren.  She has a remote history of chronic  tobacco abuse, quitting less than 1 year ago.  She denies any alcohol or  drug use.   REVIEW OF SYSTEMS:  See HPI, otherwise negative.   PHYSICAL EXAMINATION:  VITAL SIGNS:  Temperature 97.4, respirations 24,  pulse 64, blood pressure 131/87, O2 sat 97% on 2 L per minute via nasal  cannula, weight 54.1 kg, and height 60 inches.  GENERAL:  Ms. Danielski is an elderly Caucasian female who is alert,  oriented,  pleasant, cooperative, in no acute distress.  HEENT:  Sclerae clear, nonicteric.  Conjunctivae pink.  Oropharynx pink  and moist without lesions.  NECK:  Supple without mass or thyromegaly.  CHEST:  Heart, regular rate and rhythm.  Normal S1 and S2 without  murmurs, clicks, rubs, or gallops.  LUNGS:  Chronic emphysematous changes as well as expiratory wheeze  bilaterally.  She does have mildly labored respirations, but is in no  acute distress.  ABDOMEN:  Positive bowel sounds x4.  No bruits auscultated.  Soft,  nontender, mildly distended.  No rebound tenderness or guarding.  No  hepatosplenomegaly or mass.  Exam is limited due to the patient body  habitus.  EXTREMITIES:  Without edema bilaterally.   IMPRESSION:  Ms. Schlichting is a 76 year old Caucasian female with recurrent  esophageal dysphagia with history of soft peptic stricture which was  dilated in 2006 by Dr. Jena Gauss with a Elease Hashimoto dilator.  She has been  admitted to the hospital with chronic obstructive pulmonary disease  excerebration and pneumonia.  Once her respiratory status has  been  addressed, we will proceed with further evaluation of her esophagus to  determine whether she has erosive reflux esophagitis and/or the  development of recurrent stricture, web, or ring.   PLAN:  1. Continue PPI.  2. Agree with Carafate.  3. Once she is stable from respiratory standpoint, we will schedule      EGD with possible esophageal dilatation with the physician at that      time.  I have discussed the risk and benefits which include but not      limited to bleeding, infection, perforation, and drug reaction and      she agrees with this plan and consent will be obtained.      Lorenza Burton, N.P.      Jonathon Bellows, M.D.  Electronically Signed    KJ/MEDQ  D:  01/30/2009  T:  01/31/2009  Job:  [id number]   cc:   Mila Homer. [name], M.D.  Fax: [phone]

## 2011-04-30 NOTE — H&P (Signed)
[name], [name]                 ACCOUNT NO.:  [id number]   MEDICAL RECORD NO.:  [id number]          PATIENT TYPE:  INP   LOCATION:  A340                          FACILITY:  [facility]   PHYSICIAN:  Mila Homer. [name], M.D.DATE OF BIRTH:  12/09/1935   DATE OF ADMISSION:  01/29/2009  DATE OF DISCHARGE:  LH                              HISTORY & PHYSICAL   This 76 year old woman was brought to emergency room by [facility] due to difficulty breathing.  She has a long history of COPD.   She tells me she would wheeze particularly after she would eat.  This  really has gone on for about 3 months and noted that in the past she  required EGD and esophageal dilation for this.  She has had no other GI  symptomatology, no urine symptomatology and no neurological problems.  In the past, did have a small stroke and notes that she would get a  little bit dizzy after she would have extensive coughing episodes.  She  coughed productive sputum.  No real fever and chills but nightly sweats  the last 2-3 months.   She was recently hospitalized here back in December with COPD  exacerbation.  Other diagnoses include benign essential hypertension,  rheumatoid arthritis, hypercholesterolemia, reactive depression, reflux  esophagitis status post Schatzki ring dilation, chronic anxiety, also  history of a CVA and history of tobacco use.   She was discharged home at that time on furosemide 40 mg daily, Lotrel  5/20 mg daily, simvastatin 40 mg daily, pantoprazole 40 mg daily,  Cymbalta 30 mg daily, alprazolam 0.5 mg every other day p.r.n. anxiety,  Vicodin 5/500 for pain, methotrexate 2.5 mg 5 tablets once a week on  Thursdays.  The medications she brought in were the same except she is  also on prednisone 10 mg daily.   PHYSICAL EXAMINATION:  On admission, she was a pleasant, cooperative  woman.  My exam was actually a few hours afterwards in the room, but on  admission her BP was 149/53, pulse  103, respiratory rate 30, temperature  98.2.  O2 sat was 97% on 3 liters.  She was oriented, alert.  A good historian.  Sentence structure normal.  Mucous membranes are moist.  Skin turgor was normal.  Her lungs show decreased breath sounds throughout.  She is moving air  well.  No intercostal retraction.  No use of accessory muscles of  respiration.  The heart had a regular rhythm, rate of 80.  On my exam, the abdomen is soft without organomegaly or mass or  tenderness.  She had no edema of the ankles.   Her admission white cell count was 9100 of which 70% neutrophils and 14  lymphs.  Hemoglobin was 14.0 and the BMP showed a glucose of 243, bicarb  of 35.   On chest x-ray, she was seen to have what could be a lingular pneumonia,  also cardiomegaly and a persistent prominence of the right suprahilar  soft tissues which had not significantly changed since November 13, 2008  but was more prominent than in  2006, now 4 years ago.   This morning her temperature is 97.4, pulse 92, respiratory rate 16,  blood pressure 131/87.   ADMISSION DIAGNOSES:  1. Presumptive chronic obstructive pulmonary disease exacerbation.  2. Possible pneumonia.  3. Benign essential hypertension.  4. Reactive depression.  5. Rheumatoid arthritis.  6. History of tobacco use.  7. History of cerebrovascular accident.  8. Reflux esophagitis now with esophageal stricture and history of      Schatzki ring dilation  9. Chronic anxiety.  10.Abnormal chest x-ray.   She is admitted to the hospital on Rocephin a gram IV daily and  Zithromax 500 mg IV daily.  She is also on Solu-Medrol 125 mg IV q.6  hours, Lovenox 40 mg subcu daily, Cymbalta 30 mg daily, methotrexate  12.5 mg q. Thursdays, simvastatin 40 mg daily, Protonix 40 mg daily and  she also will be on Lotrel 5/20 daily.  I have talked to GI personally  about having her have another EGD and probably a dilation of a  Schatzki's ring which really should be  done this hospitalization.  Will  cut back on her Solu-Medrol immediately and switch her to prednisone and  switch other meds to p.o. meds where possible, switch her from IV 75 an  hour currently which she is on to a Hep-Lock since she is taking fluid  well.      Mila Homer. [name], M.D.  Electronically Signed     SDK/MEDQ  D:  01/30/2009  T:  01/30/2009  Job:  [id number]

## 2011-04-30 NOTE — Group Therapy Note (Signed)
[name], [name]                 ACCOUNT NO.:  [id number]   MEDICAL RECORD NO.:  [id number]          PATIENT TYPE:  INP   LOCATION:  A340                          FACILITY:  [facility]   PHYSICIAN:  Mila Homer. [name], M.D.DATE OF BIRTH:  [birth year]   DATE OF PROCEDURE:  DATE OF DISCHARGE:                                 PROGRESS NOTE   SUBJECTIVE:  She is feeling a lot better.  Swallowing is much improved.  She says she is being up around in the room.   OBJECTIVE:  VITAL SIGNS:  Temperature 98.3, pulse 98, respiratory rate  22, blood pressure 138/73.  Sugar was 149 today and O2 sat 95% on 3 L.  GENERAL:  She is sitting up in bed eating breakfast.  She is on O2 at 3  L.  She is in no acute distress.  She is well developed, well nourished,  oriented, and alert.  LUNGS:  Show decreased breath sounds throughout, but no rhonchi or  wheezing heard.  There are no intercostal retractions.  No use of  accessory muscles of respiration.  HEART:  Regular rhythm, but heart sounds somewhat distant at a rate of  about 80.  ABDOMEN:  Soft.  There is no edema to the ankles.   ASSESSMENT:  1. Chronic obstructive pulmonary disease exacerbation.  2. Possible pneumonia.  3. Esophageal obstruction status post dilation.  4. Benign essential hypertension.  5. Reactive depression.  6. Rheumatoid arthritis.  7. Chronic anxiety.  8. Diabetes exacerbated by steroids.   PLAN:  Continue her on prednisone 10 mg b.i.d. and also on her IV  antibiotics, Rocephin, and Zithromax.  She will be receiving  methotrexate 12.5 mg p.o. today for rheumatoid arthritis.  Hopefully  discharge tomorrow.      Mila Homer. [name], M.D.  Electronically Signed     SDK/MEDQ  D:  02/02/2009  T:  02/02/2009  Job:  [id number]

## 2011-04-30 NOTE — Group Therapy Note (Signed)
[name], PROWELL                 ACCOUNT NO.:  [id number]   MEDICAL RECORD NO.:  [id number]          PATIENT TYPE:  INP   LOCATION:  A340                          FACILITY:  [facility]   PHYSICIAN:  Mila Homer. [name], M.D.DATE OF BIRTH:  1935-09-15   DATE OF PROCEDURE:  DATE OF DISCHARGE:                                 PROGRESS NOTE   SUBJECTIVE:  The patient feels a good deal better today.  She notes that  2 L of oxygen did not quite hold her overnight (she is used to 3 L of  oxygen per minute at home).  She notes she take a few bites of food at  home and then have to throw up some of the food and even last night just  sipping of water she will cough shortly after sipping.   OBJECTIVE:  VITAL SIGNS:  Temperature 97.6, pulse 29, respiratory rate  18, blood pressure 141/70.  GENERAL:  She is sitting in bed, oriented, alert, in no acute distress.  Well developed, well nourished.  She has cushingoid face.  LUNGS:  Decreased breath sounds out with just occasional wheeze at the  anterior chest.  No intercostal retraction or use of accessory muscles  of respiration.  HEART:  Regular rhythm and rate of about 90 currently.  ABDOMEN:  Soft without organomegaly or mass.  There is no edema of the  ankles.   LABORATORY DATA:  Today's white cell count 16,800 of which 94%  neutrophils, 3 lymphs.  Hemoglobin 13.3 and the BNP shows a glucose of  181, bicarb 34.  O2 sats 92% on 2 L.  Sugars being 169 and 274.   ASSESSMENT:  1. Chronic obstructive pulmonary disease exacerbation.  2. Possible pneumonia  3. Probable esophageal stricture.  4. Benign essential hypertension.  5. Reactive depression.  6. Rheumatoid arthritis.  7. Chronic anxiety.  8. Diabetes, exacerbated by steroids.  9. Long-term use of a dangerous drug (steroids).   PLAN:  She is due for EGD and possible Maloney dilation today.  Reviewed  the full note from GI.  I am decreasing her prednisone to 10 mg b.i.d.,  increasing her O2  to 3 L by nasal cannula and at this point, I think,  she can sitting up  the chair b.i.d.  We will continue Rocephin and Zithromax IV.  We  discussed her steroids and how the patient will try to gradually  decrease the steroid dose, put her on inhaled steroids, and see if she  can tolerate that since she is concerned about gaining weight with very  mild truncal obesity.      Mila Homer. [name], M.D.  Electronically Signed     SDK/MEDQ  D:  01/31/2009  T:  01/31/2009  Job:  [id number]

## 2011-04-30 NOTE — Op Note (Signed)
NAME:  [name], [name]                 ACCOUNT NO.:  [id number]   MEDICAL RECORD NO.:  [id number]          PATIENT TYPE:  INP   LOCATION:  A340                          FACILITY:  [facility]   PHYSICIAN:  R. Roetta Sessions, M.D. DATE OF BIRTH:  January 03, 1935   DATE OF PROCEDURE:  01/29/2009  DATE OF DISCHARGE:  02/03/2009                               OPERATIVE REPORT   PROCEDURE:  Esophagogastroduodenoscopy with Elease Hashimoto dilation.   SURGEON:  R. Roetta Sessions, M.D.   INDICATIONS FOR PROCEDURE:  This 76 year old lady admitted to the  hospital with gastroesophageal reflux disease, chronic obstructive  pulmonary disease exacerbation and she also has esophageal dysphagia.  An EGD is now being done.  The risks, benefits and alternatives and  limitations have been reviewed.  Please see the documentation  in the  medical record.   DESCRIPTION OF PROCEDURE:  O2 saturation, blood pressure, pulse and  respirations were monitored throughout the entire procedure.  Conscious  sedation with Versed 6 mg IV and Demerol 125 mg IV in divided doses.  Cetacaine spray for topical pharyngeal anesthesia.  Instrument was  Pentax video chip system.   FINDINGS:  The examination of the tubular esophagus revealed non-  critical Schatzki's ring.  Otherwise the esophageal mucosa appeared  normal.  The EG junction was easily tranversed into the stomach.  The  gastric cavity was empty.  Insufflated well with air.  The 37 inch  gastric mucosa including retroflexed in the proximal stomach,  esophagogastric junction demonstrated only a small hiatal hernia.  The  pylorus was patent to the first portion of the bulb.  The second portion  revealed no abnormalities.  Therapeutic/diagnostic maneuvers were  performed.  The scope was withdrawn.  The 56-French [facility] dilators  were advanced without difficulty.  A look back revealed no apparent  complications related to passage of the dilator.   The patient tolerated the procedure  well.  She was reactive in  endoscopy.   IMPRESSION:  1. Non-critical-appearing Schatzki's ring, otherwise normal esophagus.  2. Small hiatal hernia, otherwise normal stomach, D-1 and D2, status      post Maloney dilation as described above.   RECOMMENDATIONS:  1. Advance diet as tolerated.  2. Would continue proton pump inhibitor therapy indefinitely.      Jonathon Bellows, M.D.  Electronically Signed     RMR/MEDQ  D:  04/06/2009  T:  04/06/2009  Job:  [id number]   cc:   Mila Homer. [name], M.D.  Fax: [phone]

## 2011-04-30 NOTE — Group Therapy Note (Signed)
NAMEMALEAHA, [name]                 ACCOUNT NO.:  [id number]   MEDICAL RECORD NO.:  [id number]          PATIENT TYPE:  INP   LOCATION:  A340                          FACILITY:  [facility]   PHYSICIAN:  Mila Homer. [name], M.D.DATE OF BIRTH:  [birth year]   DATE OF PROCEDURE:  DATE OF DISCHARGE:                                 PROGRESS NOTE   SUBJECTIVE:  She is feeling better and already swallowing better after  EGD and dilatation yesterday.  Still has some wheezing, but she says she  has been trying to be up and around the room.   OBJECTIVE:  She is sitting up in the bed on O2.  Color is good.  Temperature is 97 degrees, pulse 95, respiratory rate 20, blood pressure  138/72.  She is oriented and alert.  No acute distress.  Well developed,  well nourished.  Lungs showed decreased breath sounds throughout.  There  are some expiratory wheezes.  No intercostal retraction.  No use  accessory muscles for respiration.  The heart is a regular rhythm rate  of about 90.  No edema of the ankles.  O2 sat measured recently have  been 100%-92%.   Glucose is 154 and 278.  Today's white cell count is 15,200 of which 88%  neutrophils, 8 lymphs.  Hemoglobin 13.7, and her bicarb was 34, glucose  137.   ASSESSMENT:  1. Chronic obstructive pulmonary disease exacerbation.  2. Possible pneumonia.  3. Esophageal obstruction status post dilation.  4. Benign essential hypertension.  5. Reactive depression.  6. Rheumatoid arthritis.  7. Chronic anxiety.  8. Diabetes exacerbated by steroids, benign long-term use of steroid      drugs.   PLAN:  Continue with her IV antibiotics.  She is on a Hep-Lock.  She can  move around better.  We will see how she does with her swallowing  function today.      Mila Homer. [name], M.D.  Electronically Signed     SDK/MEDQ  D:  02/01/2009  T:  02/01/2009  Job:  [id number]

## 2011-04-30 NOTE — H&P (Signed)
[name], [name]                 ACCOUNT NO.:  [id number]   MEDICAL RECORD NO.:  [id number]          PATIENT TYPE:  INP   LOCATION:  A325                          FACILITY:  [facility]   PHYSICIAN:  Mila Homer. [name], M.D.DATE OF BIRTH:  [birth year]   DATE OF ADMISSION:  11/13/2008  DATE OF DISCHARGE:  LH                              HISTORY & PHYSICAL   This is a 77 year old who developed acute pain in the right chest after  being picked up by a family member 2 weeks ago.  Pain cleared, but came  back again later that night, and she had persistent pain in the right  chest with breathing and without breathing during that time.  Then about  2-3 days prior to admission, in addition to the pain, she felt more  fatigued and did not feel that she had energy to get up and move around,  and finally came to the emergency room for evaluation.   She has a long history of COPD, tobacco use disorder, essential  hypertension, depression, anxiety, and has a history in the past of a  stroke.   She noticed that a day or two before admission, she was coughing more.  In addition to the rib pain, she did have fever and chills.  She is  bringing up yellow sputum.   HOME MEDICATIONS:  The patient gives a history of improvement with Xanax  and Vicodin.  She is also on antihypertensives and other meds, but did  not have those with her.   Other than being terribly weak, she had no muscular symptoms, no CNS  symptomatology, and she had no real problems with either GI or GU  symptoms.  She did have pain in the right chest as already noted, this  seemed to get worse with respirations.   SOCIAL HISTORY:  She lives in town.  She has family in town including 2  granddaughters.   PHYSICAL EXAMINATION:  VITAL SIGNS:  Admission blood pressure is 137/60,  pulse 102, respiratory rate 32, and temperature 99.1.  GENERAL:  At the time I examined her, she was oriented and alert in no  acute distress, well developed,  and well nourished.  HEENT:  She had chipmunk facies.  Mucous membranes were moist.  NECK:  There was no axillary or supraclavicular adenopathy.  LUNGS:  Decreased breath sounds throughout.  She was moving air well.  However, no intercostal retraction.  No use of accessory muscles of  respiration, but egophony was noted in the posterior lung fields  bilaterally at around T6.  ABDOMEN:  Soft without organomegaly or mass.  There was no edema of the  ankles.  SKIN:  Turgor was normal.   Admission white cell count was 12,000, of which 74% neutrophils and 15  lymphs.  H&H 15.3 and 46.9, MCV of 96, and platelet count of 148,000.  BMP showed a bicarb of 36 and a BNP was less than 30.   Chest x-ray showed hyperinflation consistent with COPD.  She also had  moderate osteopenia and some borderline right suprahilar soft tissue  fullness.  ADMISSION DIAGNOSES:  1. Chronic obstructive pulmonary disease exacerbation.  2. Right chest pain secondary to trauma.  3. History of tobacco use.  4. History of cerebrovascular accident.  5. Benign essential hypertension.  6. Depression.  7. Reflux esophagitis status post Schatzki ring.  8. Chronic anxiety.   Currently, she is on IV steroids and seems to be somewhat better, but  with the coughing up of yellow sputum, I am adding IV antibiotics as  well.  She will also be going back on her antihypertensives if needed,  and I am getting full record of her medications from the office.  Continue with O2 at 3 L.  Recheck a CBC and MET-7 in the morning.      Mila Homer. [name], M.D.  Electronically Signed     SDK/MEDQ  D:  11/15/2008  T:  11/15/2008  Job:  [id number]

## 2011-04-30 NOTE — Group Therapy Note (Signed)
[name], [name]                 ACCOUNT NO.:  [id number]   MEDICAL RECORD NO.:  [id number]          PATIENT TYPE:  INP   LOCATION:  A325                          FACILITY:  [facility]   PHYSICIAN:  Mila Homer. [name], M.D.DATE OF BIRTH:  [birth year]   DATE OF PROCEDURE:  DATE OF DISCHARGE:                                 PROGRESS NOTE   SUBJECTIVE:  She is feeling better today.  Reviewed med sheets on from  the office.  Usually, she takes Lotrel 10/20 daily, she is on  methotrexate 2.5 mg 5 a week for her rheumatoid arthritis, she is on  Cymbalta 30 mg daily for depression, furosemide 40 mg daily for edema,  and simvastatin 40 mg daily for her hypercholesterolemia.   OBJECTIVE:  VITAL SIGNS:  Her temperature is 98 degrees, pulse 119,  respiratory rate 20, and blood pressure 189/98.  Her blood pressures  yesterday were in the 140/80-90 range and the last blood pressure 160  over about 100.  O2 sats 91% on 3 L.  GENERAL:  She is orient and alert in no acute distress, well developed  and mildly obese.  She has cushingoid facies.  LUNGS:  Decreased breath sounds throughout and some expiratory wheezing  throughout, but she is moving air well.  No intercostal retractions.  No  use of accessory muscles of respiration.  HEART:  Regular rhythm without murmur rate of 100 today.  ABDOMEN:  Soft without organomegaly or mass.  EXTREMITIES:  There is no edema of the ankles.   LABORATORY STUDIES:  Today's white cell count is 15,000 of which 92% are  neutrophils and 5 lymphs.  Glucose 142.   ASSESSMENT:  1. Chronic obstructive pulmonary disease exacerbation.  2. Right chest pain secondary to trauma, now improved.  3. Benign essential hypertension.  4. Reactive depression.  5. Rheumatoid arthritis.  6. History of tobacco use.  7. History of cerebrovascular accident.  8. Reflux esophagitis status post Schatzki ring dilation.  9. Chronic anxiety.  10.Reflux esophagitis.   PLAN:  We will  hep-lock her today.  Continue the Rocephin and the  Zithromax IV.  I have cut back her Solu-Medrol from 125 q.8 h. to  prednisone 20 mg b.i.d.  I am adding back pantoprazole 40 mg daily for  gastroesophageal  reflux disease, Lotrel 10/20 daily for hypertension, furosemide 40 mg  daily for edema, Cymbalta 30 mg daily for depression, and methotrexate  2.5 mg 5 tablets a week each Thursday for her rheumatoid arthritis.  She  is to ambulate today.      Mila Homer. [name], M.D.  Electronically Signed     SDK/MEDQ  D:  11/16/2008  T:  11/16/2008  Job:  [id number]

## 2011-04-30 NOTE — Discharge Summary (Signed)
NAMEMINDI, AKERSON                 ACCOUNT NO.:  [id number]   MEDICAL RECORD NO.:  [id number]          PATIENT TYPE:  INP   LOCATION:  A340                          FACILITY:  [facility]   PHYSICIAN:  Mila Homer. [name], M.D.DATE OF BIRTH:  19-Sep-1935   DATE OF ADMISSION:  01/29/2009  DATE OF DISCHARGE:  LH                               DISCHARGE SUMMARY   HISTORY OF PRESENT ILLNESS:  This 76 year old woman was in the hospital  with COPD/pneumonia and also esophageal stricture.  She had a benign 5-  day hospitalization extending January 30, 2009 to February 03, 2009.  Vital signs remained stable.   Admission white cell count is 9100, hemoglobin 14, platelet count  280,000.  Differential normal.  Recheck white cell count 16,800 with 94%  neutrophils, 3 lymphs and 15,200 with 88% neutrophils, eight lymphs.  Hemoglobin stayed stable at 13.7.  Admission BMP showed a bicarb of 35,  glucose 243.  Rechecked glucose 181 and 137 during hospitalization.   Chest x-ray on admission showed lingular pneumonia, cardiomegaly and a  persistent right suprahilar soft tissue opacity.   She was admitted to the hospital.  She was given IV half-normal saline  75 ml hour, given O2 at 2 liters and later 3 (which she uses at home)  and maintains O2 sats above 90%.  She was started on Solu-Medrol 125 mg  IV q.6 h, Zithromax 500 mg IV daily, Rocephin 1 gram IV daily, Lovenox  40 mg subcu daily, and had Accu-Cheks a.c. and h.s. with sliding scale  insulin coverage.  She is also continued on Cymbalta 30 mg daily,  methotrexate 12.5 mg every Thursday simvastatin 40 mg daily, Protonix 40  mg daily.  She had DuoNeb q.4 h while awake.   Her second day she saw GI due to symptoms of esophageal stricture.  This  time she has already improved.  IV was stopped, put on Hep-Lock.  Solu-  Medrol stopped and she was put on prednisone 20 mg b.i.d.  She is also  on Lotrel 05/20 daily for blood pressure.  Prednisone was decreased  to  10 mg b.i.d.   Dr. Jena Gauss, gastroenterologist, performed EGD and [facility] dilation for  her on her second day.  After that, she was able to eat and swallow much  easier.  She came to improve and was finally ready for discharge home on  her fifth day February 03, 2009.   Arrangements were made for home health to see her twice a week for a  couple of weeks, keep track of swallowing and breathing.   FINAL DISCHARGE DIAGNOSES:  1. Pneumonia.  2. COPD exacerbation.  3. Esophageal stricture status post Maloney dilation.  4. Benign essential hypertension.  5. Reactive depression.  6. Hyperglycemia secondary to steroids.  7. Rheumatoid Arthritis.  8. History tobacco use.  9. History of cerebrovascular accident.  10.Chronic anxiety.  11.Normal chest x-ray.   At home, she is to continue prednisone 10 mg daily, furosemide 40 mg  daily, Cymbalta 30 mg daily, methotrexate 2.5 mg 5 tablets once a week  on Thursdays, simvastatin  4 mg daily, Protonix 40 mg daily.  She has now  had 5 days of Zithromax and we will put her on Ceftin 500 mg b.i.d. for  5 days #10 no refills.  Will follow-up in the office next week.      Mila Homer. [name], M.D.  Electronically Signed     SDK/MEDQ  D:  02/03/2009  T:  02/03/2009  Job:  [phone] [id number]

## 2011-05-03 NOTE — Group Therapy Note (Signed)
[name], BROTHERS                 ACCOUNT NO.:  [id number]   MEDICAL RECORD NO.:  [id number]          PATIENT TYPE:  INP   LOCATION:  A220                          FACILITY:  [facility]   PHYSICIAN:  Mila Homer. [name], M.D.DATE OF BIRTH:  05-04-1935   DATE OF PROCEDURE:  DATE OF DISCHARGE:                                   PROGRESS NOTE   SUBJECTIVE:  She has had no more attacks of abdominal/chest pain since she  was admitted last night.   OBJECTIVE:  She currently is getting a breathing treatment.  She is oriented  and alert, in no acute distress.  We developed, somewhat obese.  Temperature  is 97.4, pulse 78, respiratory rate 20, blood pressure 149/72.  Her lungs  show decreased breath sounds throughout, but she is moving well.  Color is  good.  There were no intercostal retractions or use of accessory muscles of  respiration.  The heart has a regular rhythm, rate of about 80.  The abdomen  is soft without hepatosplenomegaly or mass, and no epigastric tenderness  today on palpation.  There is no edema in the ankles.  Today she weighs 150  pounds, and she is 6 feet 1 inch tall.   ASSESSMENT:  1.  Acute abdominal pain which could be secondary to biliary spasm, peptic      ulcer disease or coronary artery disease.  2.  Chronic obstructive pulmonary disease exacerbation.  3.  Tobacco use disorder.   PLAN:  Continue IV antibiotics.  She had her abdominal ultrasound within the  last hour, but the results are still pending.  Discussed with the GI service  today.  She will need an EGD for what is probably an esophageal stricture,  and this may be done either today or tomorrow.      Mila Homer. [name], M.D.  Electronically Signed     SDK/MEDQ  D:  08/20/2005  T:  08/20/2005  Job:  [id number]

## 2011-05-03 NOTE — Consult Note (Signed)
[name], [name]                 ACCOUNT NO.:  [id number]   MEDICAL RECORD NO.:  [id number]          PATIENT TYPE:  INP   LOCATION:  A220                          FACILITY:  [facility]   PHYSICIAN:  R. Roetta Sessions, M.D. DATE OF BIRTH:  07/11/1935   DATE OF CONSULTATION:  08/20/2005  DATE OF DISCHARGE:                                   CONSULTATION   REFERRING PHYSICIAN:  Mila Homer. [name], M.D.   REASON FOR CONSULTATION:  Dysphagia.   HISTORY OF PRESENT ILLNESS:  The patient is a 76 year old, Caucasian female  with history of COPD, hypertension and CVA who presented to the hospital  yesterday with two episodes of anterior chest pain.  Pain began abruptly.  It was in the lower sternal region.  It lasted for 15-20 minutes at a time  and severe in nature.  She had some confusion with it.  She denies any  increased shortness of breath from her baseline.  Pain went away and then  returned later in the day and that is when she decided to come to the  hospital.  She also has intermittent dysphagia to certain foods.  At times,  the food becomes lodged and she has to vomit.  She denies having this  sensation yesterday.  She has had increased cough for 2-3 days of thick,  yellow sputum.  She has nocturnal acid reflux symptoms.  She use to be on  Prevacid, but insurance stopped covering this and the alternative medication  she could not tolerate due to nausea and vomiting.  She does not remember  the pain.  She complains of postprandial abdominal bloating.  Bowel  movements are regular.  No melena or rectal bleeding.  No weight loss noted.  She has never had an EGD or colonoscopy.   Chest x-ray revealed COPD, left basilar scarring, borderline heart size.  CBC was unremarkable.  LFTs, amylase and lipase normal.  Total CK normal.  CK-MB was 2.1, then 6.3, troponin negative x2.   MEDICATIONS PRIOR TO ADMISSION:  1.  Xanax 1 mg t.i.d.  2.  Lotrel.  3.  Albuterol nebulizer.  4.  Advair.  5.   Cymbalta.   ALLERGIES:  DECADRON caused swelling of the mouth.   PAST MEDICAL HISTORY:  1.  History of CVA with no residual side effects except for difficulty with      thought process at time.  2.  Hypertension.  3.  COPD.   PAST SURGICAL HISTORY:  1.  Tubal ligation.  2.  Hiatal hernia repair.  3.  Right eye post vitrectomy, removal of implant and insertion of lens.   FAMILY HISTORY:  Negative for colorectal cancer or chronic GI illnesses.   SOCIAL HISTORY:  She is widowed.  She has two living children.  She is  raising two of her granddaughters.  She smokes 7 cigarettes daily.  She has  smoked most of her life.  Denies any alcohol use.   REVIEW OF SYSTEMS:  GASTROINTESTINAL:  See HPI.  CARDIOPULMONARY:  See HPI.  CONSTITUTIONAL:  No weight loss.  HEENT:  Complains  of chronic sinus  drainage.   PHYSICAL EXAMINATION:  VITAL SIGNS:  Weight 150, height 61 inches,  temperature 97.4, pulse 78, respirations 20, blood pressure 149/72.  GENERAL:  Pleasant, well-developed, well-nourished, elderly, Caucasian  female in no acute distress.  SKIN:  Warm and dry, no jaundice.  HEENT:  Conjunctivae are pink.  Sclerae nonicteric.  Oropharyngeal mucosa  moist and pink.  No lesions, erythema or exudate.  No lymphadenopathy.  CHEST:  Lungs reveal expiratory and inspiratory wheezes throughout.  CARDIAC:  Regular rate and rhythm with distant heart sounds.  No murmurs,  rubs or gallops appreciated.  ABDOMEN:  Positive bowel sounds, soft, nontender, nondistended, no  organomegaly or masses.  No rebound tenderness or guarding.  EXTREMITIES:  No edema.   LABORATORY DATA AND X-RAY FINDINGS:  White count 8800, hemoglobin 16,  hematocrit 47.5, platelets 200,000.  Sedimentation rate 5.  Sodium 133,  potassium 3.7, BUN 7, creatinine 0.7, glucose 107.  Total bilirubin 0.4, Alk  phos 94, AST 27, ALT 20, albumin 3.9, amylase 30, lipase 25.  Total CK 80,  CK-MB 2.1, then 6.3.  Troponin less than 0.5, then  0.2.   Chest x-ray as mentioned above.   IMPRESSION:  The patient is a 76 year old, Caucasian female who presented  with two isolated episodes of severe lower sternal chest pain yesterday.  She denies having these symptoms ever before.  She has had some increased  cough, but no real increased shortness of breath with this episode.  She has  intermittent esophageal dysphagia to certain foods.  Some times the food  becomes lodge and she has to vomit.  Suspect she has an esophageal ring or  stricture.  With regards to her chest pain, this could be from multiple  etiologies including biliary, esophageal and cardiac.  These all need to be  considered and worked up.  Given her chronic tobacco abuse and hypertension,  she is at risk for heart disease.   RECOMMENDATIONS:  1.  Continue Protonix.  2.  Follow up with abdominal ultrasound.  3.  Esophagogastroduodenoscopy with esophageal dilatation.  4.  Colonoscopy for screening purposes at a later date.      Tana Coast, P.AJonathon Bellows, M.D.  Electronically Signed    LL/MEDQ  D:  08/20/2005  T:  08/20/2005  Job:  [id number]

## 2011-05-03 NOTE — Group Therapy Note (Signed)
[name], [name]                 ACCOUNT NO.:  [id number]   MEDICAL RECORD NO.:  [id number]          PATIENT TYPE:  OBV   LOCATION:  A220                          FACILITY:  [facility]   PHYSICIAN:  Mila Homer. [name], M.D.DATE OF BIRTH:  [birth year]   DATE OF PROCEDURE:  08/21/2005  DATE OF DISCHARGE:                                   PROGRESS NOTE   SUBJECTIVE:  The patient feels much better today.  The severe pain she was  having in the anterior chest cleared while she was having her EGD and  Maloney dilatation.  She was sort of groggy but half awake while this was  going on.  Noted she felt much better immediately after that was done.   OBJECTIVE:  VITAL SIGNS:  Temperature 99, pulse 86, respirations 20, blood  pressure 124/57.  LUNGS:  Lungs showed decreased breath sounds, but she is moving air well.  There are no intercostal retractions.  There is no use of accessory muscles  respiration.  HEART:  Regular rhythm with rate of about 80.  Heart sounds somewhat faint.  ABDOMEN:  Soft without hepatosplenomegaly or mass.  There is no tenderness  of the epigastrium at present.  EXTREMITIES:  There is no edema of the ankles.  Color is good on O2.  O2 saturations were 90% on 2.5 liters, 88% 2 liters,  and 93% 4 liters.   ASSESSMENT:  1.  Chronic obstructive pulmonary disease exacerbation.  2.  Reflux esophagitis with an esophageal stricture now treated with [facility]      dilatation.  3.  Dyspnea on exertion secondary to hypoxia.  4.  Tobacco use disorder.   PLAN:  Will switch him to p.o. Levaquin 500 mg daily, starting tonight.  Will make arrangements for home O2 based on her O2 evaluation by discharge  planning.  Will continue her on Protonix 40 mg p.o. daily and will discharge  her on a proton pump inhibitor to prevent further stricture.  The patient  notes at many times when she is working, how she gets dizzy and faint, but  if she lies down a little bit, those symptoms would  clear.   ASSESSMENT:  I think the patient's two episodes of severe pain in the chest  were probably secondary to esophageal spasm due to her stricture.  The  stricture may have also contributed to episodes of right upper quadrant lung  disease in that she is spillover of partially chewed food into her trachea.  The patient, herself, said that she would cough may times right after eating  to be consistent with this.      Mila Homer. [name], M.D.  Electronically Signed     SDK/MEDQ  D:  08/21/2005  T:  08/22/2005  Job:  [id number]

## 2011-05-03 NOTE — Op Note (Signed)
NAME:  [name], [name]                           ACCOUNT NO.:  [id number]   MEDICAL RECORD NO.:  [id number]                   PATIENT TYPE:  OIB   LOCATION:  5533                                 FACILITY:  [facility]   PHYSICIAN:  Alford Highland. Rankin, M.D.                DATE OF BIRTH:  06/12/1935   DATE OF PROCEDURE:  04/28/2003  DATE OF DISCHARGE:  04/29/2003                                 OPERATIVE REPORT   PREOPERATIVE DIAGNOSES:  1. Dislocated posterior chamber intraocular implant into the vitreous,     nonmagnetic foreign body, right eye.  2. Aphakia.   POSTOPERATIVE DIAGNOSES:  1. Dislocated posterior chamber intraocular implant into the vitreous,     nonmagnetic foreign body, right eye.  2. Aphakia.   PROCEDURES:  1. Posterior vitrectomy, right eye.  2. Removal of posterior implant.  3. Insertion of posterior chamber intraocular lens into the sulcus.  4. Repair of operative wound, site of previous paracentesis from primary     surgery.   SURGEON:  Turkey R. Rankins, M.D.   ANESTHESIA:  General endotracheal anesthesia.   INDICATION FOR PROCEDURE:  The patient has a profound vision loss on the  basis of dislocated intraocular lens in the vitreous cavity.  This is an  attempt to remove the dislocated lens but also to implant a posterior  chamber intraocular lens so as to correct the aphakic situation.  The  patient understands the risks of anesthesia, including the rare occurrence  of death, losses to the eye including but not limited to hemorrhage,  infection, scarring, need for another surgery, no change of vision, loss of  vision despite intervention.  An appropriate signed consent was obtained.   She was taken to the operating room.  In the operating room appropriate  monitoring was followed by general endotracheal anesthesia.  The right  periocular region was sterilely prepped and draped in the usual ophthalmic  fashion.  A lid speculum applied.  Conjunctival peritomies  then fashioned  superiorly and temporally.  A 4mm infusion was secured 3.5 mm posterior to  the limbus.  Placement in the vitreous cavity was verified.  Superior  sclerotomies were then fashioned.  Wall microscope placed in position.  Core  vitrectomy was then begun.  Excellent mobilization of the intraocular lens  was obtained.  It was thus necessary to create a grooved limbal incision  superiorly.  The anterior chamber was deepened with viscoelastic.  The  anterior chamber wound was then opened. DORC forceps were then used to grasp  the intraocular lens and brought through the central capsule opening and  into the anterior chamber and retrieved without difficulty.  At this time a  posterior chamber intraocular lens implant model [id number] [employer],  power +25.0, was then inserted into the sulcus and rotated.  No sutures for  fixation were necessary, as there was excellent capsule support 360 degrees.  It must be noted that prior to the removal of the foreign body-type lens  that the 10-0 nylon was used to close the previous paracentesis site for  wound patency.   At this time the viscoelastic was aspirated from the eye.  The superior  sclerotomies were then closed with 7-0 Vicryl suture.  Prior to closure the  peripheral retina was inspected and found to be free of retinal holes or  tears.  The infusion removed and similarly closed with 7-0 Vicryl.  Conjunctiva closed with 7-0 Vicryl.  Subconjunctival injection of antibiotic  and steroid applied.  The patient tolerated the procedure well without  complication.  She was taken to be discharged home as an outpatient after a  trip to the recovery room.                                               Alford Highland Rankin, M.D.    GAR/MEDQ  D:  07/25/2003  T:  07/25/2003  Job:  [id number]

## 2011-05-03 NOTE — Discharge Summary (Signed)
[name], [name]                 ACCOUNT NO.:  [id number]   MEDICAL RECORD NO.:  [id number]          PATIENT TYPE:  OBV   LOCATION:  A220                          FACILITY:  [facility]   PHYSICIAN:  Mila Homer. [name], M.D.DATE OF BIRTH:  [birth year]   DATE OF ADMISSION:  08/19/2005  DATE OF DISCHARGE:  09/07/2006LH                                 DISCHARGE SUMMARY   HISTORY OF PRESENT ILLNESS:  A 76 year old admitted to the hospital after  having two episodes of severe substernal chest pain noted to the lower  sternum where it meets the epigastrium.  She had a benign 4-day hospital  course extending from August 19, 2005, through August 22, 2005.  During  the hospitalization, she had an EGD which showed an erosion of the  esophageal gastric junction and a noncritical stricture.  She was also found  to have bronchitis in the hospital.   LABORATORY DATA AND X-RAY FINDINGS:  Her admission chest x-ray showed  borderline heart side with COPD, left basilar scarring, but no acute  disease.  The ultrasound of the abdomen was felt to be normal.  Her EKG was  essentially normal.   Blood work included H&H of 16.0 and 47.5, white cell count 8800.  BMP showed  sodium 133, glucose 109.  Cardiac markers were normal as was hepatic  function, amylase, lipase, sedimentation rate.  CK was 80, MB 6.3, troponin  0.02.  Repeat BMP shows sodium 133, glucose 107 and sputum culture was  negative.  Her lipid profile showed cholesterol 225, triglycerides 242, HDL  cholesterol 45, LDL cholesterol 132.   HOSPITAL COURSE:  She was admitted to the hospital and started on D-5-1/2  normal saline at 75 mL per hour and Protonix 40 mg IV q.24h., enteric coated  aspirin 81 mg daily, Tylenol 650 mg q.4h. p.r.n., albuterol ipratropium by  nebulizer q.4h., Levaquin 500 mg q.24h.  Consultation was asked with  gastroenterology.   She was continued on Alprazolam 1 mg t.i.d., Lotrel 5/10 daily (amlodipine  and  Benazepril), Advair 100/50 b.i.d., Cymbalta 60 mg daily and was given O2  at 2 L by nasal cannula.   During the hospitalization, her O2 saturation stayed in the low 90s, upper  80s on 2 L to 2.5 L and she noted to me that sometimes at home she would  work and get very dizzy and faint enough to lie down and have a fan blow  over her and then she began to get better.   EGD was done as noted above showing the mild obstruction in the distal  esophagus.  She felt immediately better after this was done and did not have  any further lower sternal pain.  After EGD, she was started on Carafate  t.i.d.   She is much improved by her fourth day and ready for discharge home.   DISCHARGE DIAGNOSES:  1.  Severe chest pain secondary to esophageal ring.  2.  Schatzki's ring.  3.  Chronic obstructive pulmonary disease exacerbation.  4.  Tobacco use disorder.  5.  Essential hypertension.  6.  Depression.  7.  Chronic anxiety.   DISCHARGE MEDICATIONS:  1.  Home nebulizer.  2.  Albuterol nebulizer q.i.d. with home O2 at 2 L per minute.  3.  Protonix 40 mg daily (30, 11 refills).  4.  Levaquin 5 mg daily x7 days (7 refills).  5.  Alprazolam 1 mg t.i.d.  6.  Coated aspirin 81 mg daily.  7.  Advair 100/50 puff b.i.d.  8.  Cymbalta 60 mg daily.  9.  Lotrel 5/10 daily.   FOLLOW UP:  Followup is to be in the office within the week.   Time spent to day reviewing her paper and electronic record, discussing with  nursing and discharge planning, arranging for her oxygen and nebulizer,  writing prescriptions, formulating a plan and dictating a note was 35  minutes.      Mila Homer. [name], M.D.  Electronically Signed     SDK/MEDQ  D:  08/22/2005  T:  08/22/2005  Job:  [id number]   cc:   R. Roetta Sessions, M.D.  [address]  [place]  [place] [address]   Lionel December, M.D.  [address]  [place]  [place] [address]

## 2011-05-03 NOTE — Op Note (Signed)
[name], [name]                 ACCOUNT NO.:  [id number]   MEDICAL RECORD NO.:  [id number]          PATIENT TYPE:  INP   LOCATION:  A220                          FACILITY:  [facility]   PHYSICIAN:  R. Roetta Sessions, M.D. DATE OF BIRTH:  09/12/1935   DATE OF PROCEDURE:  08/20/2005  DATE OF DISCHARGE:                                 OPERATIVE REPORT   PROCEDURE:  Esophagogastroduodenoscopy with Elease Hashimoto dilation.   INDICATIONS FOR PROCEDURE:  A 76 year old lady admitted with lower  retrosternal upper abdominal discomfort with intermittent esophageal  dysphagia for months. Ultrasound of the abdomen negative earlier today per  Dr. Frazier Richards. EGD is now being done. This approach has been discussed with  the patient at length. Potential risks, benefits, and alternatives have been  reviewed and questions answered. She is agreeable. Please see documentation  in the medical record.   PROCEDURE NOTE:  O2 saturation, blood pressure, pulse, and respirations were  monitored throughout the entire procedure. Conscious sedation with Versed 3  mg IV and Demerol 75 mg IV in divided doses. Cetacaine spray for topical  oropharyngeal anesthesia.   INSTRUMENT:  Olympus video chip system.   FINDINGS:  Examination of the tubular esophagus revealed erosions at the EG  junction extending no more than a half a centimeter. There appeared to be a  very soft peptic stricture that appeared to be benign. There was no evidence  of Barrett's esophagus. EG junction was easily traversed with the scope.   Stomach:  Gastric cavity was empty and insufflated well with air. Thorough  examination of gastric mucosa including retroflexed view of the proximal  stomach and esophagogastric junction demonstrated only a small hiatal hernia  and a couple of antral erosions. Pylorus patent and easily traversed.  Examination of bulb and second portion revealed no abnormalities.   THERAPEUTIC/DIAGNOSTIC MANEUVERS:  A 56-French Maloney  dilator was passed to  full insertion without resistance. A look back revealed no apparent  complications related to the passage of the dilator. The patient tolerated  the procedure well and was reactive to endoscopy.   IMPRESSION:  1.  Soft, short, noncritical appearing stricture with associated distal      esophageal erosions status post dilation as described above. Otherwise      normal esophagus.  2.  Small hiatal hernia. Couple of antral erosions. Otherwise normal      stomach. Patent pylorus. Normal D1 and D2.   RECOMMENDATIONS:  1.  Continue protein pump inhibitor.  2.  Carafate 1 g slurries t.i.d. for 3 days.  3.  Advance diet as tolerated.      Jonathon Bellows, M.D.  Electronically Signed     RMR/MEDQ  D:  08/20/2005  T:  08/20/2005  Job:  [id number]   cc:   Mila Homer. [name], M.D.  [address] [place], [place] [address]  Fax: [phone]

## 2011-05-03 NOTE — H&P (Signed)
[name], [name]                 ACCOUNT NO.:  [id number]   MEDICAL RECORD NO.:  [id number]          PATIENT TYPE:  INP   LOCATION:  A220                          FACILITY:  [facility]   PHYSICIAN:  Mila Homer. [name], M.D.DATE OF BIRTH:  06/22/1935   DATE OF ADMISSION:  08/19/2005  DATE OF DISCHARGE:  LH                                HISTORY & PHYSICAL   This is a 76 year old woman who was home tonight when she had two attacks of  an anterior chest epigastric abdominal pain that came on suddenly.  Each one  lasted 15-20 minutes and then resolved.  During the attack, she was confused  in severe pain, bent over, and spitting up some clear material.  The patient  does give a history of intermittent dysphagia for certain foods.  Most of  the time she will swallow fine, but sometimes food gets stuck and then she  will throw up everything that she has eaten.  This has gone on for some  years.  She is 74 but has not had a colonoscopy yet.   She has smoked fairly heavily for years, now down to about seven cigarettes  a day.  She did have a CVA a few years back and is going on aspirin for this  daily.  She is accompanied to the hospital by her son and daughter-in-law.  Currently, she is painfree.   She does note that she has been coughing over the last 2-3 days, bringing up  some thick yellow sputum from deep in the lung.  Usually, she will have  occasional coughing, but this is much more than usual, and it is associated  with some shortness of breath.  She has had no palpitations, really no pain  in the chest radiating to the neck or the shoulder.  She has had a history  of reflux esophagitis symptoms with pain radiating up through the sternum  towards the neck and with acid brash with this but has not had this symptom  today.  Otherwise, she is negative for GI or GU symptoms.   PHYSICAL EXAMINATION:  GENERAL:  Admission exam shows a pleasant 34ish-year-  old woman who comes in no acute  distress.  She appears to be a pretty good  historian.  Sentence structure was intact.  There is no slurring of her  speech.  She is alert, oriented, well-developed, well-nourished.  VITAL SIGNS:  The official vital signs are pending.  HEENT:  Pharynx is normal.  NECK:  There are negative anterior cervical nodes.  There is no axillary or  supraclavicular  adenopathy.  LUNGS:  Decreased breath sounds throughout but she is moving air well.  Color is good.  HEART:  Regular rate and rhythm of about 70.  ABDOMEN:  Soft without hepatosplenomegaly or mass but there is some  tenderness on deep palpation of the epigastrium, some in the right upper  quadrant.  There is slight distention of the abdomen.  There is no edema of  the ankles.   ADMISSION DIAGNOSES:  1.  Epigastric pain which can represent gallbladder disease,  peptic ulcer      disease, or other etiologies, including myocardial infarction.  2.  Tobacco use disorder.  3.  Intermittent dysphagia, perhaps secondary to her Schatzki's ring.  4.  Esophageal reflux.  5.  Status post cerebrovascular accident.  6.  Bacterial bronchitis.  7.  Chronic obstructive pulmonary disease exacerbation.   Plan of treatment will be gallbladder ultrasound in the morning but in the  meantime, will put her on IV D5-1/2 normal saline at 75 cc/hr.  Check CBC,  CMET, amylase, lipase, sed rate, lipids, CPK, CK-MB, and troponin.  Will  keep her on Protonix 40 mg IV q.24h., use ASA 81 mg daily, and have GI see  her for her intermittent dysphagia.  Will treat her with albuterol and  ipratropium by nebulizer q.4h. while awake, Levaquin 500 mg IV q.24h. and  have respiratory give a sputum for Gram's stain and C&S.   I have reviewed her EKG.  There is no sign of acute MI.  Heart is a normal  rhythm.  Normal rate of 81.   Time spent with this patient tonight, reviewing her electrolytes, reviewing  her paper medical records, talking to the patient and her family,  examining  the patient, discussing with nursing and physician staff in the ER,  formulating a plan, dictating a note, and writing orders was 50 minutes.  She is an acute admission and has multiple medical problems.      Mila Homer. [name], M.D.  Electronically Signed     SDK/MEDQ  D:  08/19/2005  T:  08/19/2005  Job:  [id number]

## 2011-09-17 LAB — DIFFERENTIAL
Basophils Relative: 0 % (ref 0–1)
Eosinophils Absolute: 0.3 10*3/uL (ref 0.0–0.7)
Eosinophils Relative: 3 % (ref 0–5)
Monocytes Relative: 9 % (ref 3–12)
Neutrophils Relative %: 74 % (ref 43–77)

## 2011-09-17 LAB — BASIC METABOLIC PANEL
BUN: 10 mg/dL (ref 6–23)
CO2: 36 mEq/L — ABNORMAL HIGH (ref 19–32)
Chloride: 98 mEq/L (ref 96–112)
Creatinine, Ser: 0.64 mg/dL (ref 0.4–1.2)
Potassium: 3.9 mEq/L (ref 3.5–5.1)

## 2011-09-17 LAB — CBC
HCT: 46.9 % — ABNORMAL HIGH (ref 36.0–46.0)
MCHC: 32.5 g/dL (ref 30.0–36.0)
MCV: 96.9 fL (ref 78.0–100.0)
Platelets: 148 10*3/uL — ABNORMAL LOW (ref 150–400)
RBC: 4.84 MIL/uL (ref 3.87–5.11)

## 2011-09-17 LAB — GLUCOSE, CAPILLARY: Glucose-Capillary: 212 mg/dL — ABNORMAL HIGH (ref 70–99)

## 2011-09-19 LAB — CBC
HCT: 41.8 % (ref 36.0–46.0)
Hemoglobin: 13.9 g/dL (ref 12.0–15.0)
MCHC: 33.2 g/dL (ref 30.0–36.0)
MCV: 96.5 fL (ref 78.0–100.0)
Platelets: 122 10*3/uL — ABNORMAL LOW (ref 150–400)
RDW: 14.2 % (ref 11.5–15.5)

## 2011-09-19 LAB — BASIC METABOLIC PANEL
BUN: 14 mg/dL (ref 6–23)
CO2: 35 mEq/L — ABNORMAL HIGH (ref 19–32)
GFR calc non Af Amer: >60 mL/min (ref >60)
Glucose, Bld: 142 mg/dL — ABNORMAL HIGH (ref 70–99)
Potassium: 4.1 mEq/L (ref 3.5–5.1)
Sodium: 139 mEq/L (ref 135–145)

## 2011-09-19 LAB — DIFFERENTIAL
Basophils Absolute: 0 10*3/uL (ref 0.0–0.1)
Basophils Relative: 0 % (ref 0–1)
Eosinophils Absolute: 0 10*3/uL (ref 0.0–0.7)
Eosinophils Relative: 0 % (ref 0–5)
Lymphocytes Relative: 5 % — ABNORMAL LOW (ref 12–46)
Monocytes Absolute: 0.4 10*3/uL (ref 0.1–1.0)

## 2011-09-19 LAB — GLUCOSE, CAPILLARY: Glucose-Capillary: 112 mg/dL — ABNORMAL HIGH (ref 70–99)

## 2012-09-11 IMAGING — CR DG CHEST 1V
1 series · 1 of 1 positions shown · non-contrast
Comparison: 06/07/2009.

CLINICAL DATA: Fall.  Hip pain.  Hip fracture.

CHEST - 1 VIEW

[view not recorded]
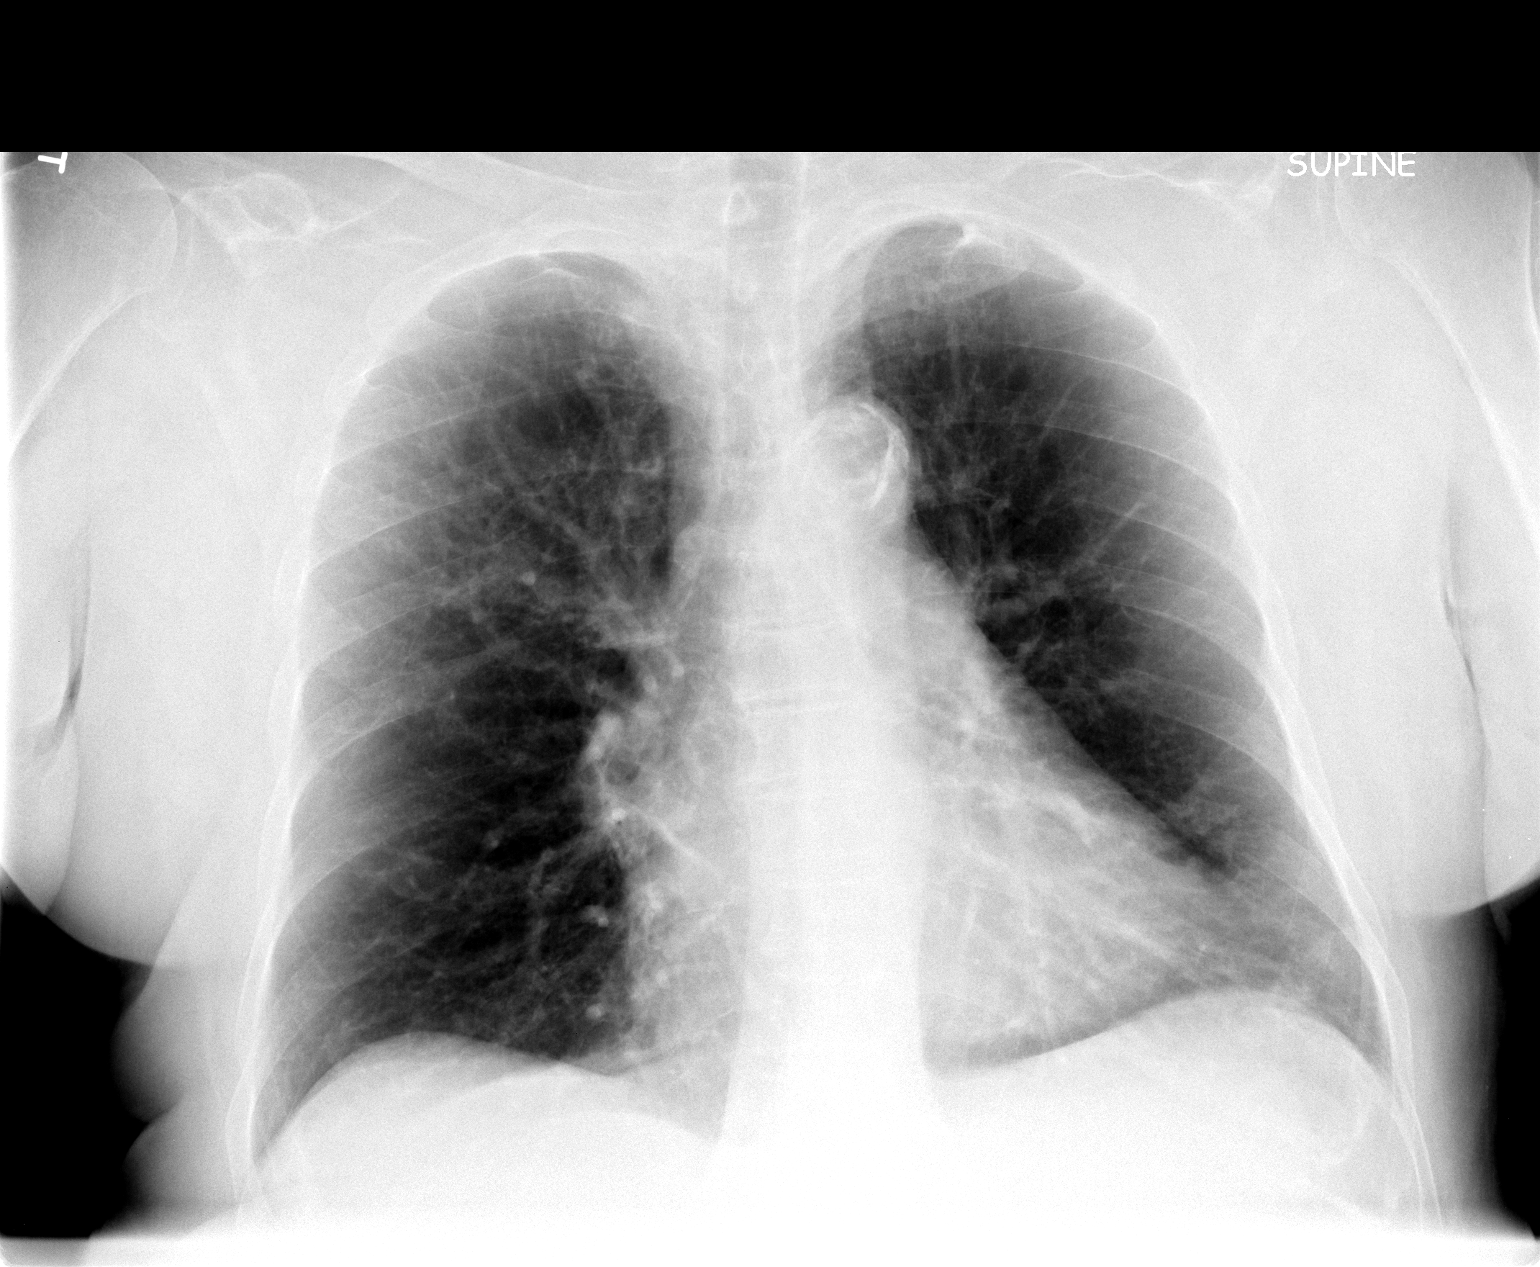

[1 of 1 positions shown; findings below may reference images not displayed]

FINDINGS: Enlargement of the cardiopericardial silhouette is
present compatible with mild cardiomegaly.  Atherosclerosis of the
aortic arch.  No airspace disease.  Left basilar atelectasis.  No
effusion.  Apical lordotic projection.
IMPRESSION: Cardiomegaly without failure.  Left base atelectasis.  No acute
abnormality.

## 2012-09-13 IMAGING — RF DG HIP OPERATIVE*R*
1 series · 5 of 5 positions shown · non-contrast
Comparison: Radiographs 10/23/2010.

CLINICAL DATA: Right hip fracture ORIF.

OPERATIVE RIGHT HIP

[Series 1: run · 5 of 5 slices shown]
[im 1/5]
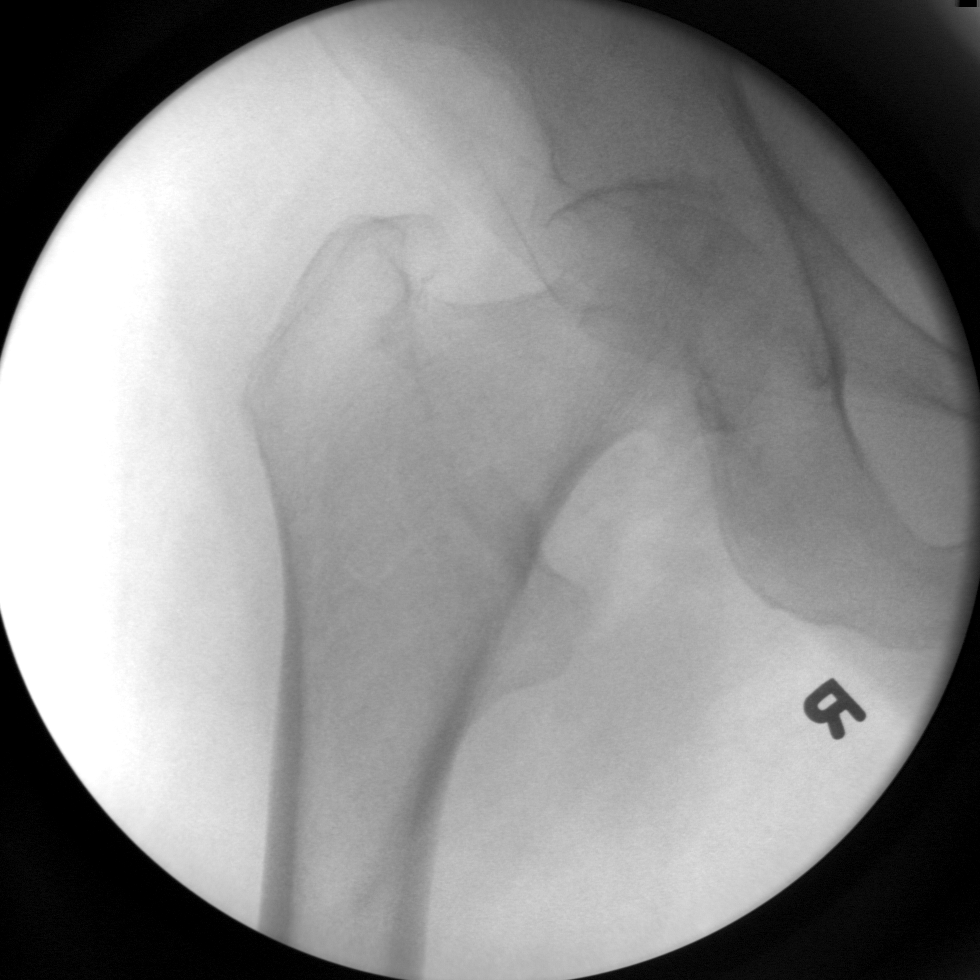
[im 2/5]
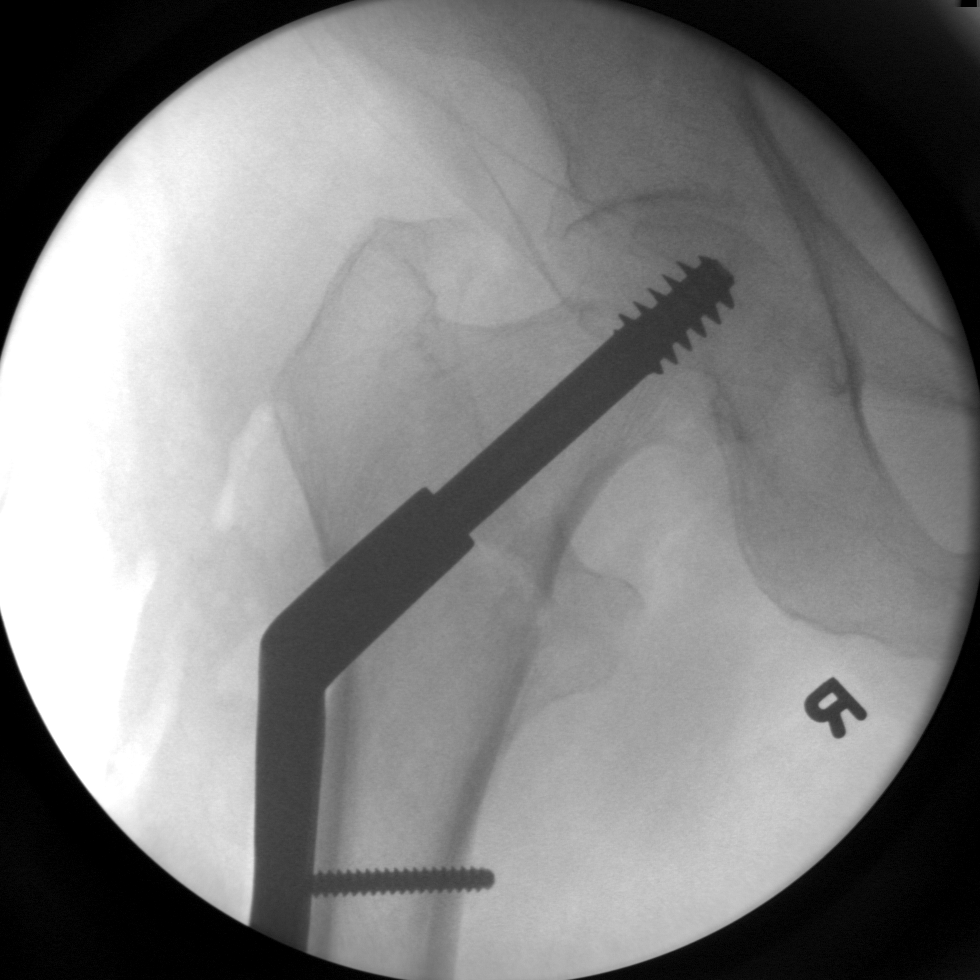
[im 3/5]
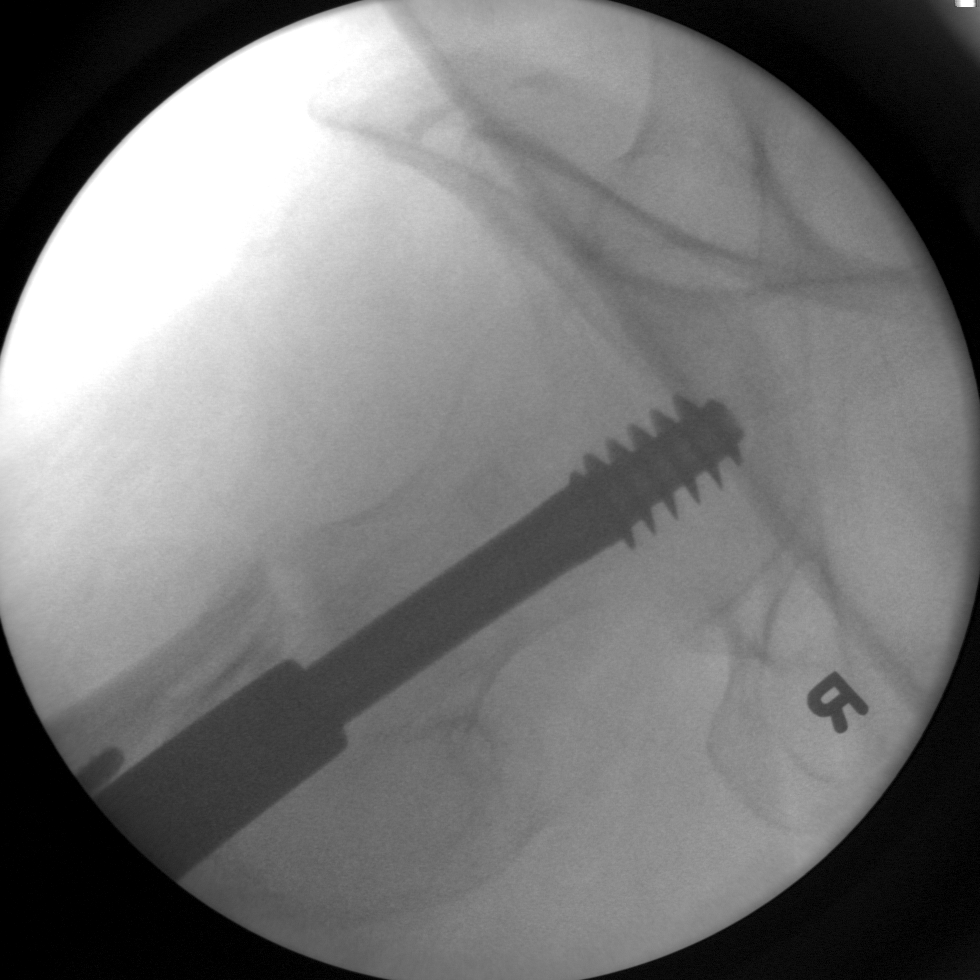
[im 4/5]
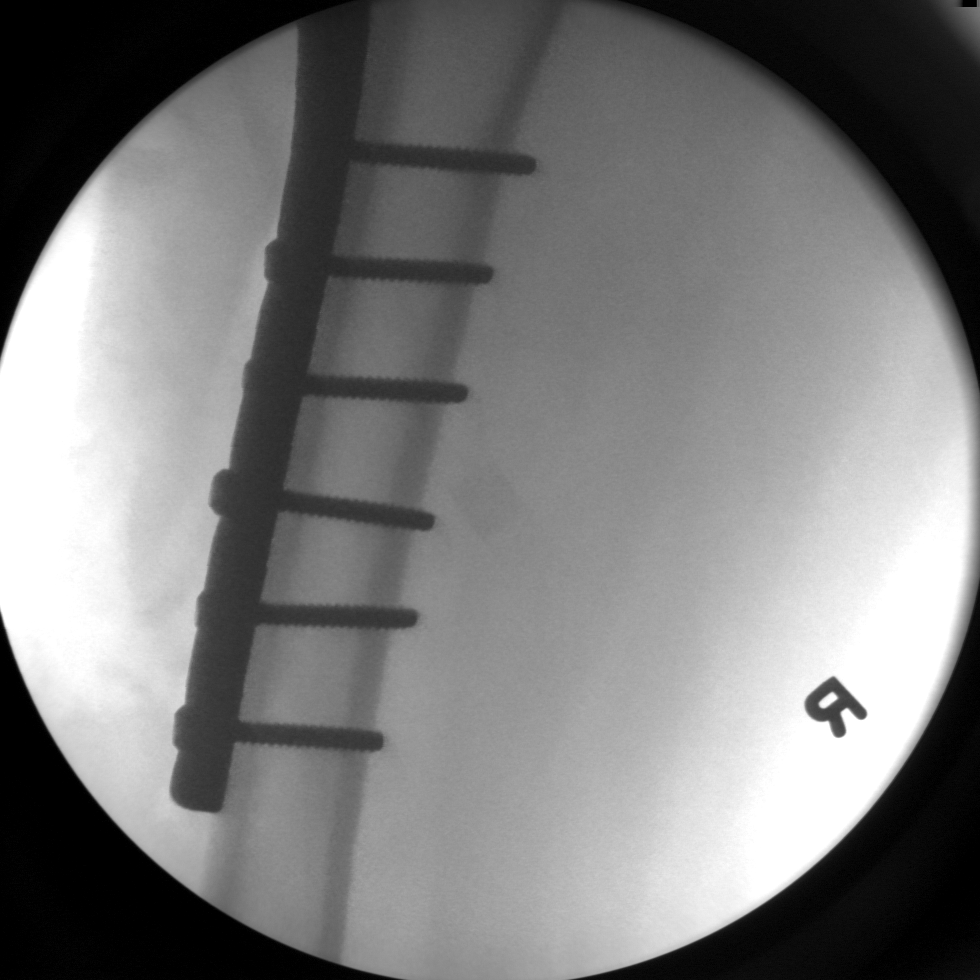
[im 5/5]
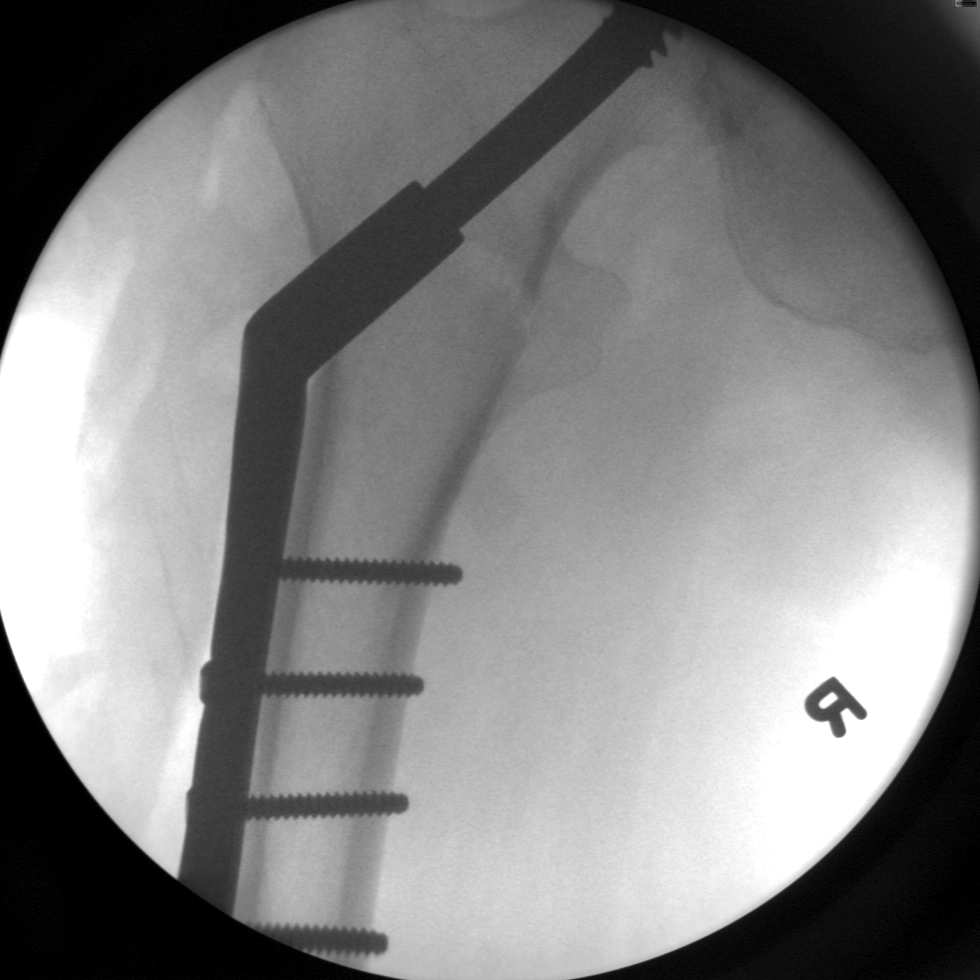

[5 of 5 positions shown; findings below may reference images not displayed]

FINDINGS: Five spot fluoroscopic images demonstrate the placement
of a compression screw plate across the intertrochanteric right
femur fracture.  There is anatomic reduction of the main fracture
fragments.  Hardware appears well positioned.
IMPRESSION: Near anatomic reduction of right femoral intertrochanteric fracture
status post ORIF.

## 2012-11-03 IMAGING — CR DG CHEST 2V
2 series · 2 of 2 positions shown · non-contrast
Comparison: 12/05/2010

CLINICAL DATA: Fever.  Cough.  Congestion.

CHEST - 2 VIEW

[view not recorded (1 of 2)]
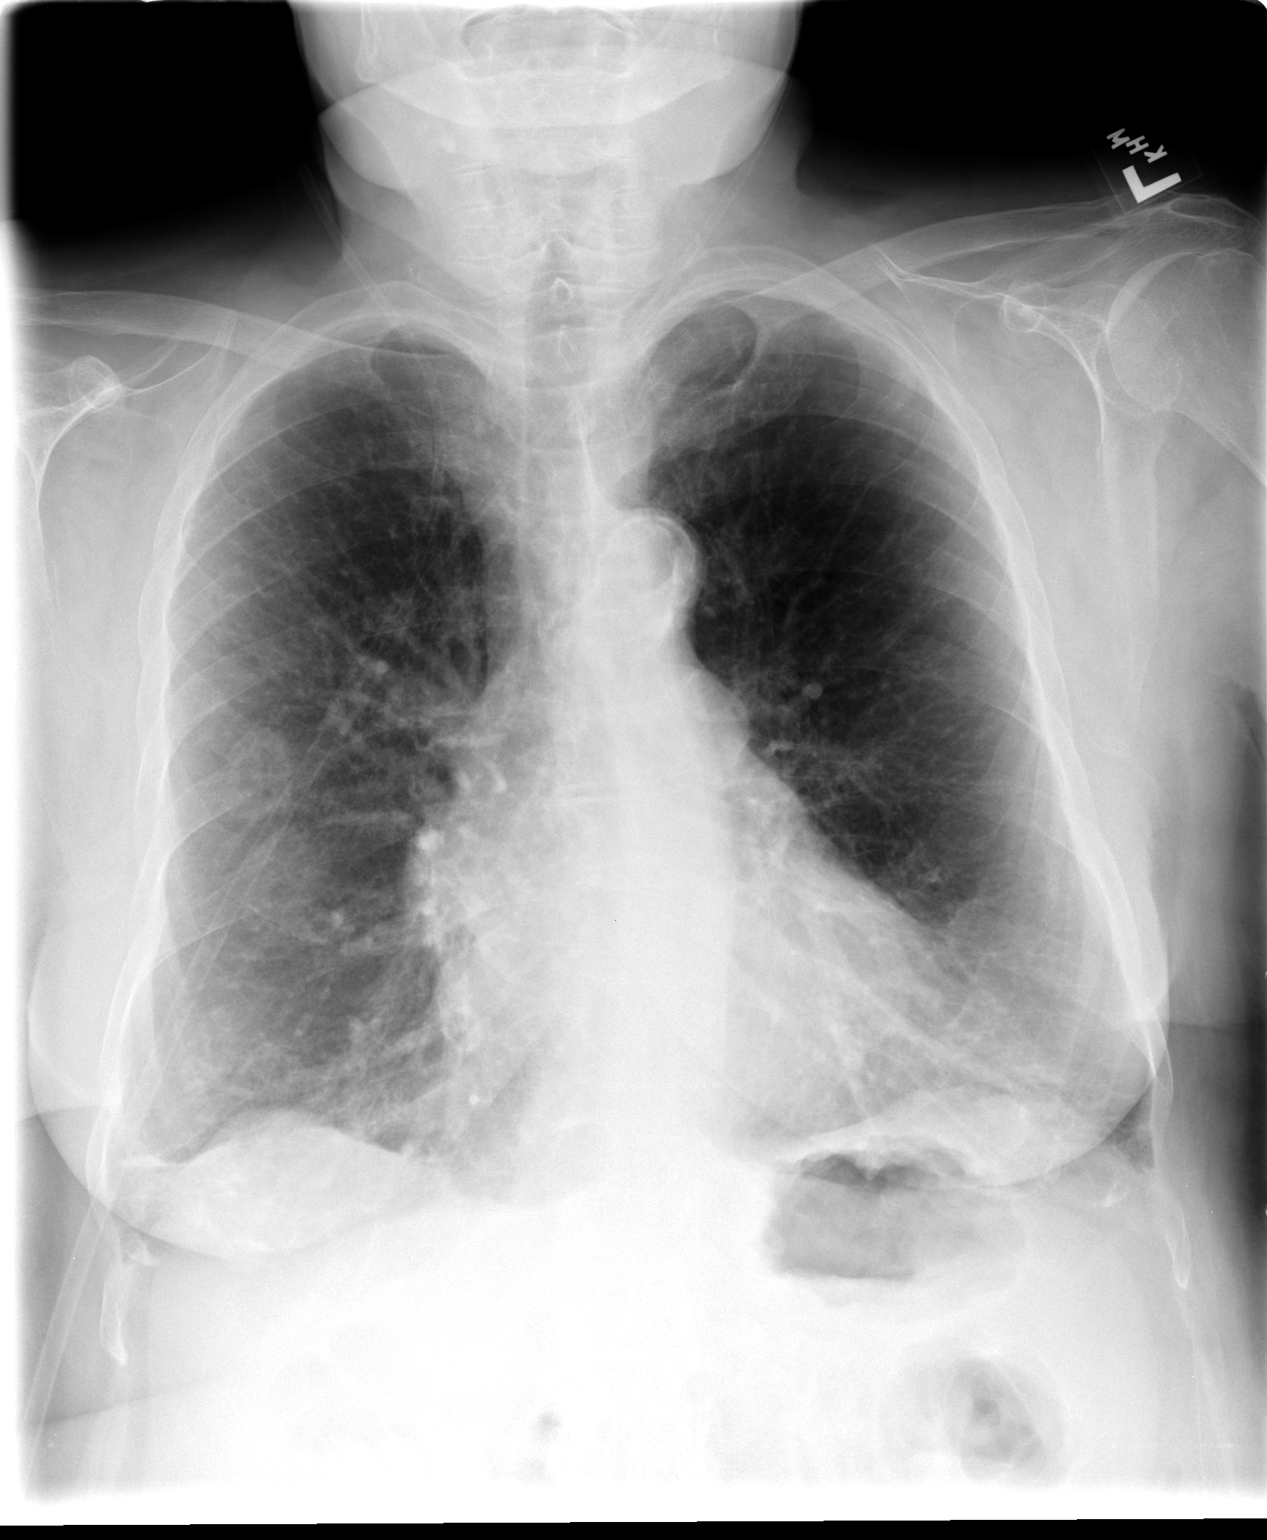

[view not recorded (2 of 2)]
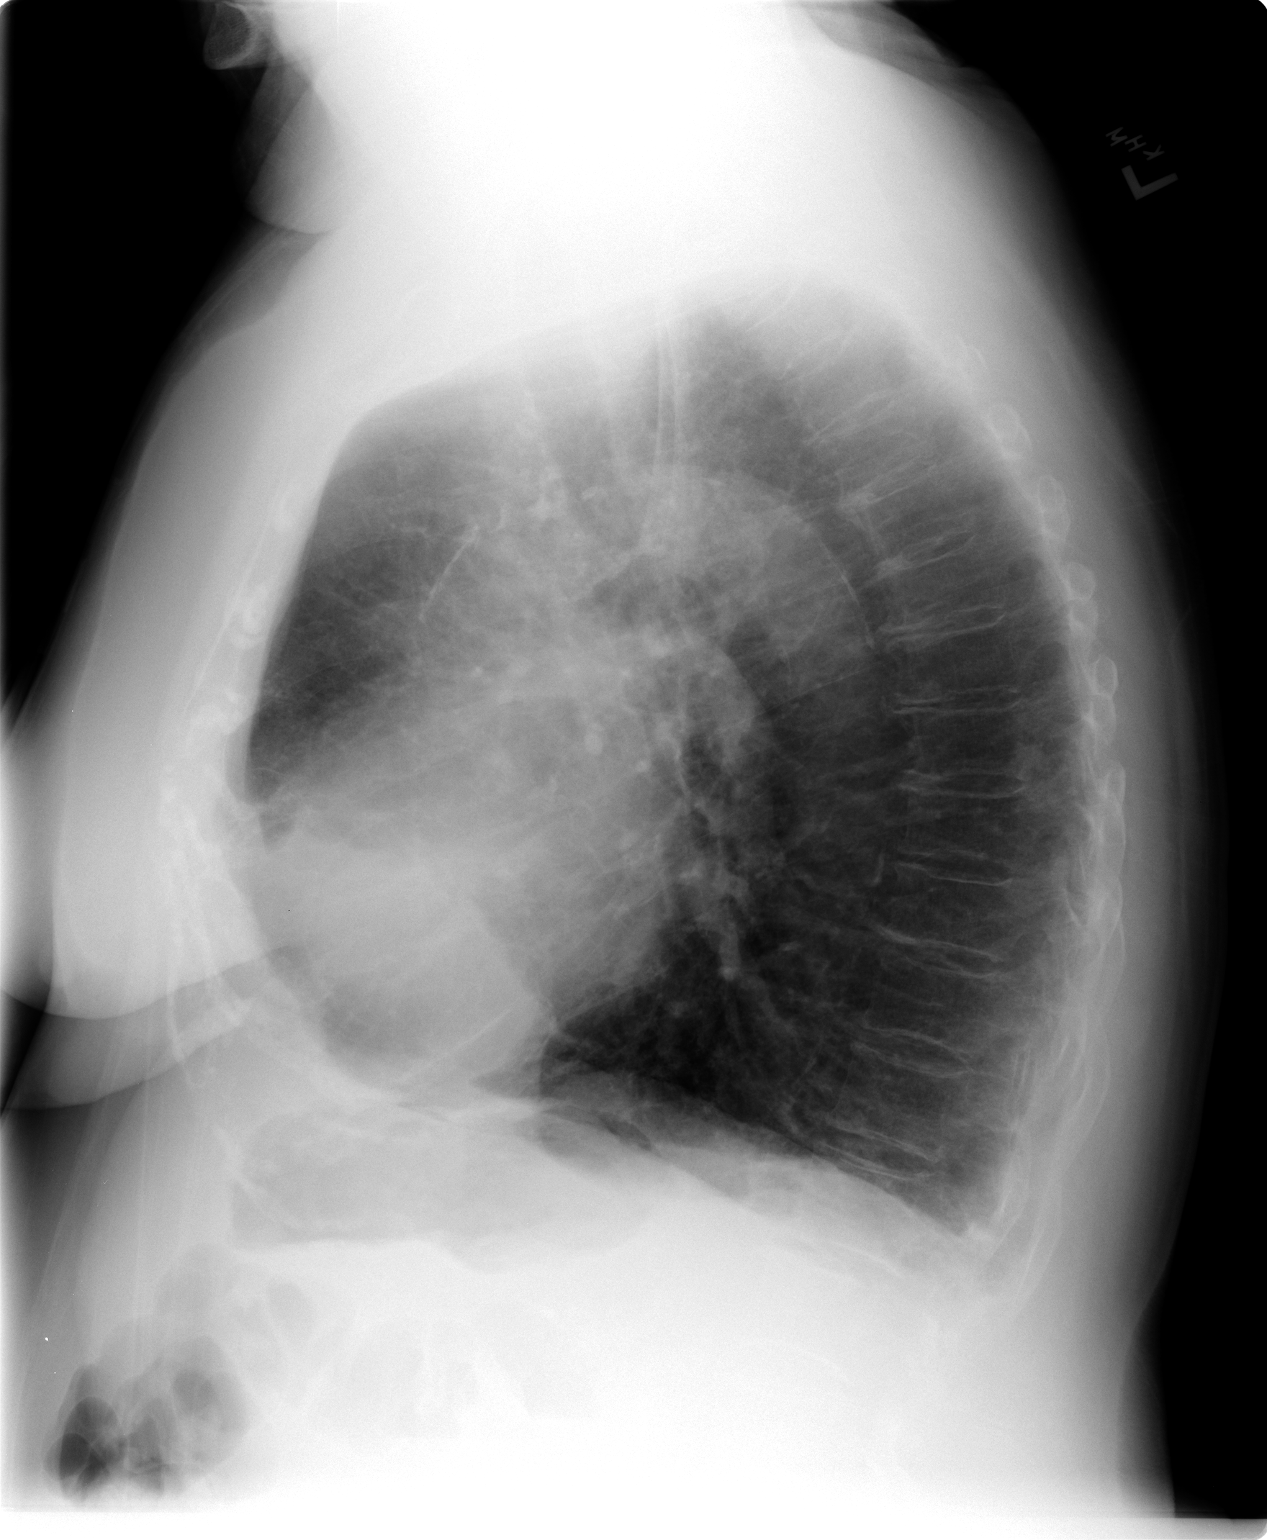

[2 of 2 positions shown; findings below may reference images not displayed]

FINDINGS: Hyperinflation.  Mild osteopenia.  Midline trachea.  Mild
cardiomegaly.  Atherosclerosis in the transverse aorta. No pleural
effusion or pneumothorax.  Diffuse interstitial thickening.
Scarring at the left lung base.  Apparent increased density
projecting over the right midlung is not readily apparent on the
prior exam.  Chronic interstitial thickening.
IMPRESSION: 1.  Hyperinflation and chronic interstitial thickening consistent
with COPD.
2.  No acute superimposed process identified.
3.  Focal density projecting over the right midlung on the frontal
view.  Question callus deposition from anterior rib fractures.  Of
note, right anterior second through fourth rib fractures were
identified on the 06/07/2009 exam.  Consider radiographic  follow-
up at 3 months to confirm stability or resolution.

## 2012-11-27 IMAGING — CR DG HIP COMPLETE 2+V*R*
2 series · 2 of 2 positions shown · non-contrast
Comparison: 11/23/2010

CLINICAL DATA: Hip fracture post repair

RIGHT HIP - COMPLETE 2+ VIEW

[view not recorded (1 of 2)]
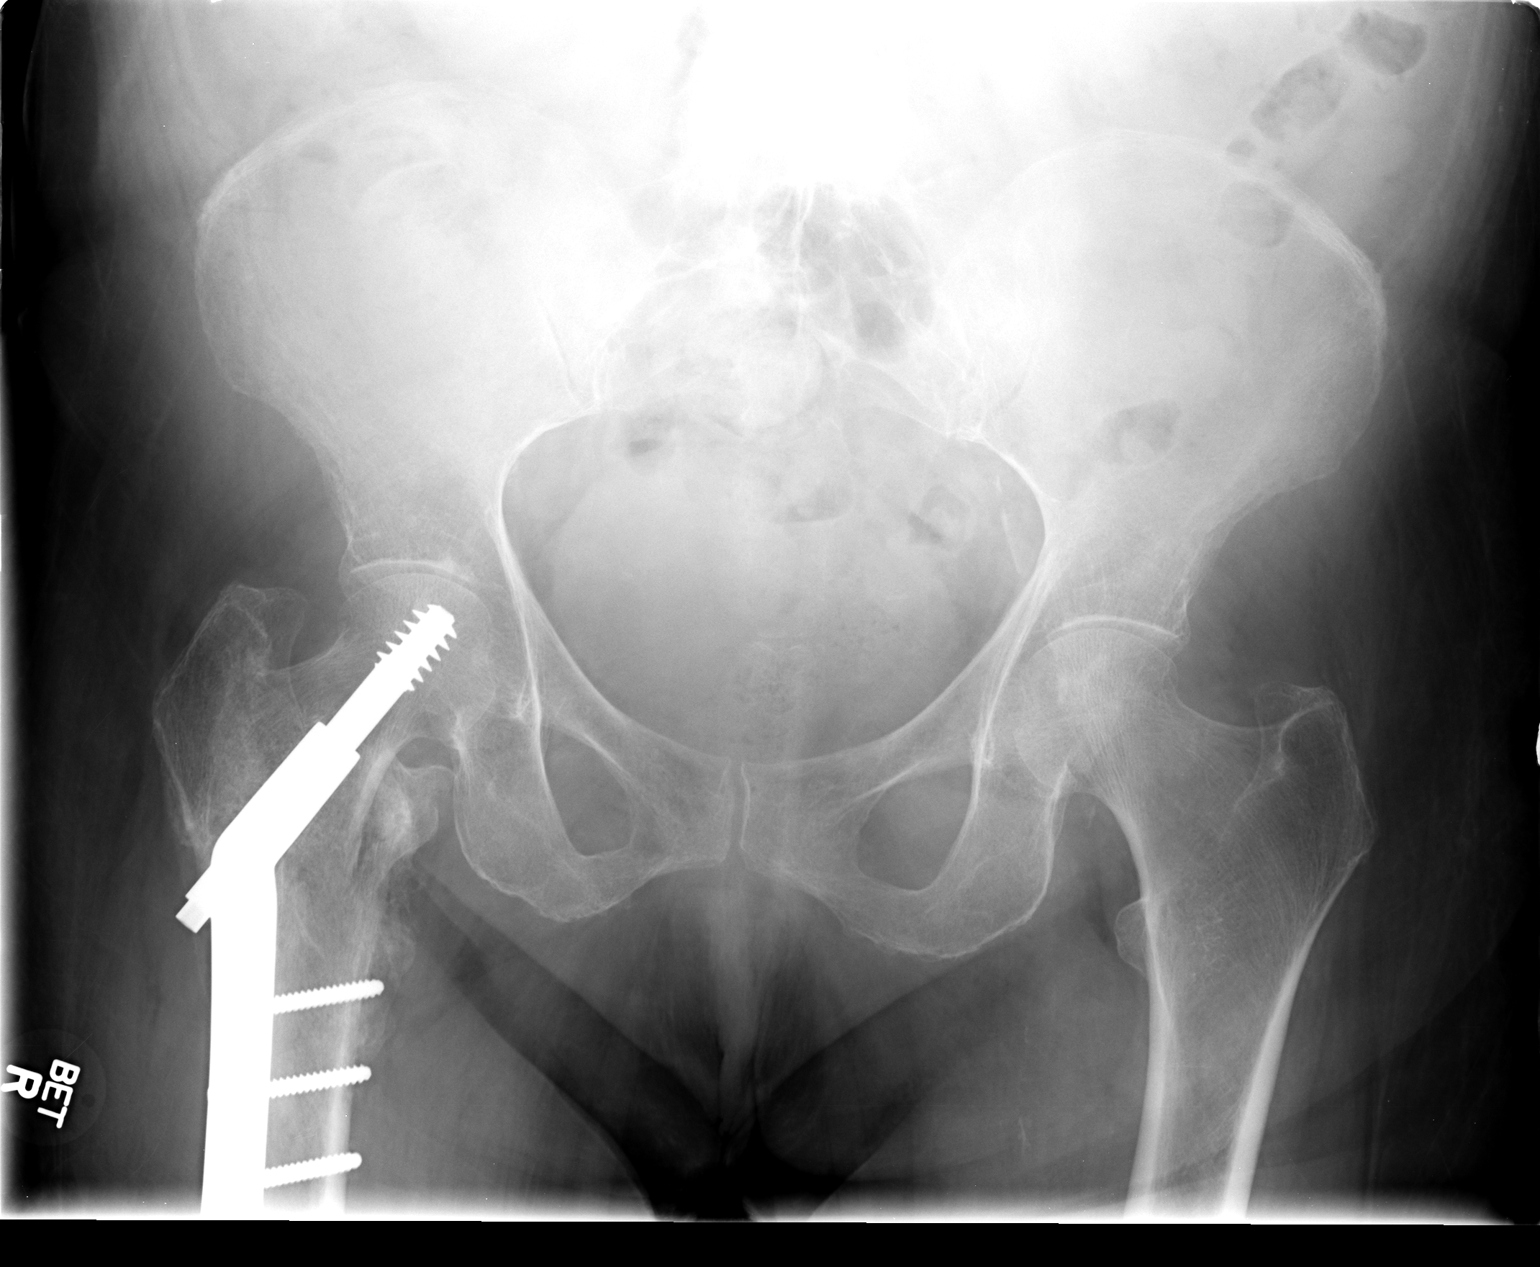

[view not recorded (2 of 2)]
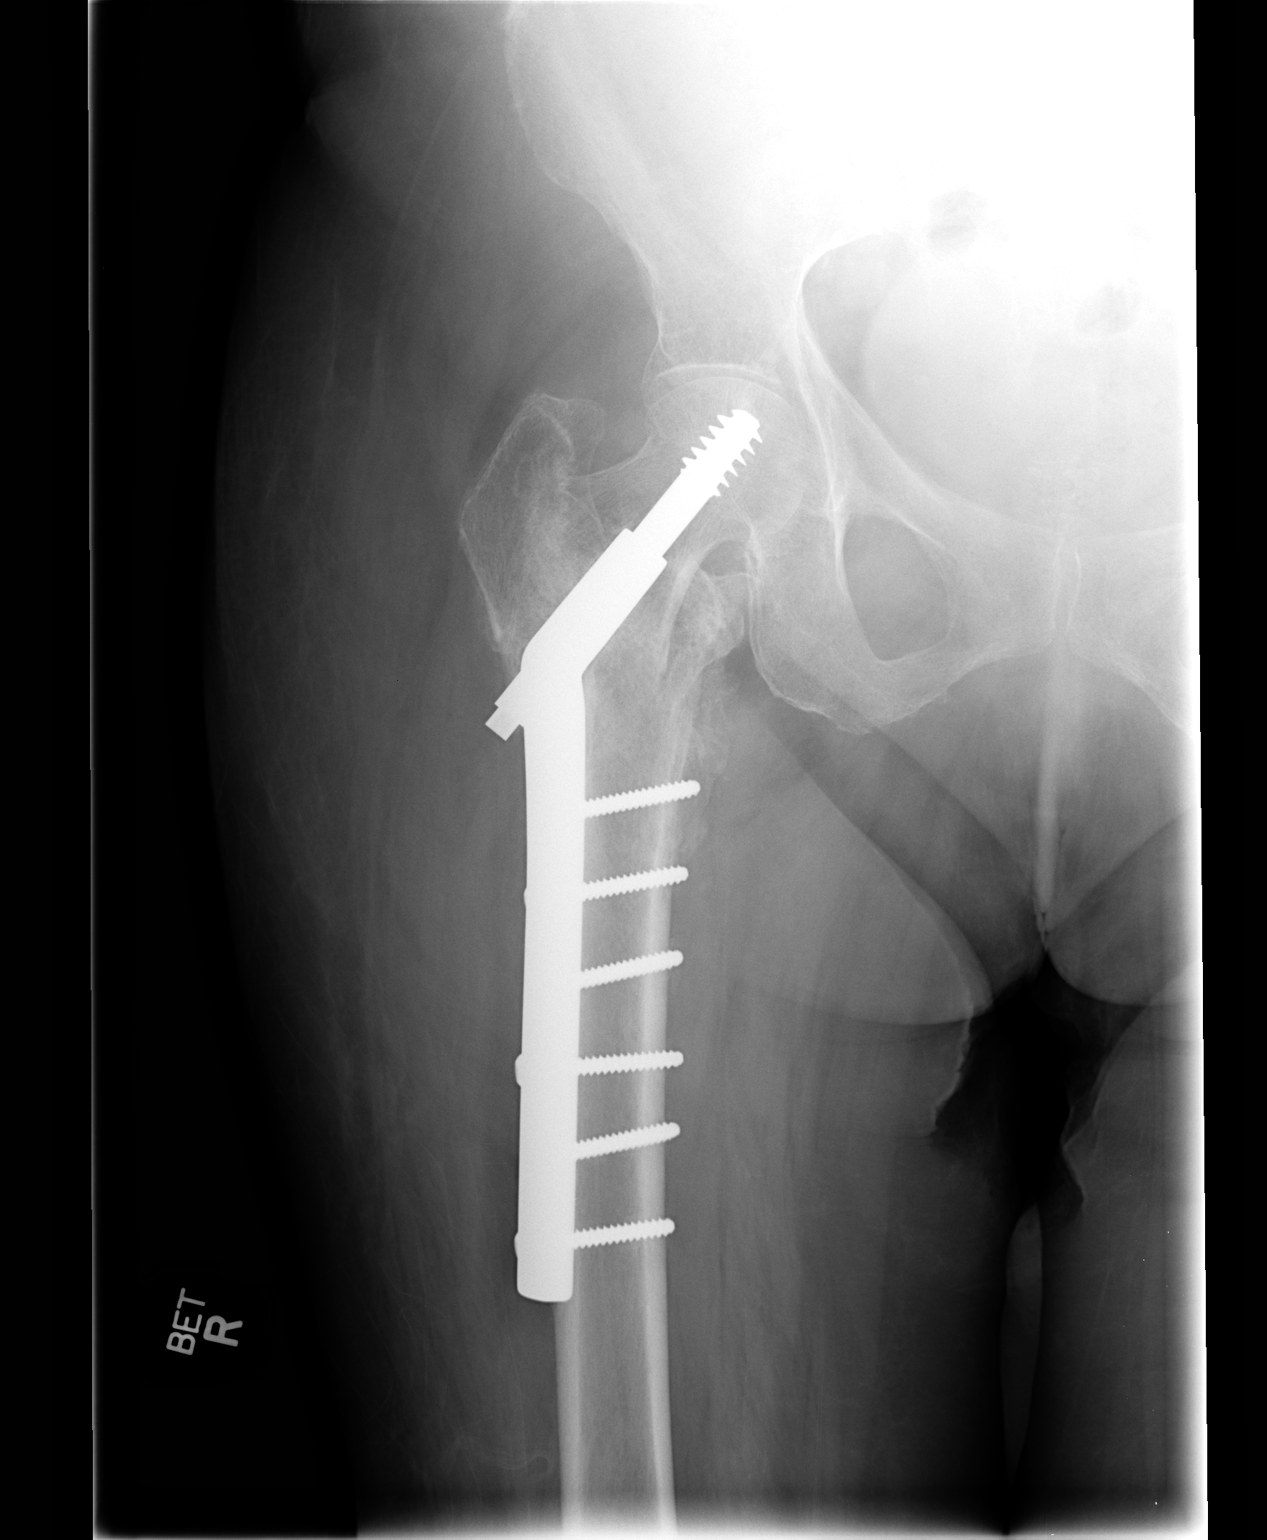

[2 of 2 positions shown; findings below may reference images not displayed]

FINDINGS: Diffuse osseous demineralization.
Compression screw and plate at proximal right femur post ORIF of
intertrochanteric fracture.
Appearance unchanged since prior exam.
Bilateral narrowing of the hip joints.
No new fracture, dislocation, or bone destruction seen.
IMPRESSION: Post ORIF of intertrochanteric fracture right femur.
Osseous demineralization.
Degenerative changes of bilateral hip joints.
No acute abnormalities.

## 2013-05-09 ENCOUNTER — Emergency Department (HOSPITAL_COMMUNITY): Payer: Medicare Other

## 2013-05-09 ENCOUNTER — Inpatient Hospital Stay (HOSPITAL_COMMUNITY)
Admission: EM | Admit: 2013-05-09 | Discharge: 2013-05-13 | DRG: 193 | Disposition: A | Discharge status: Home Health | Payer: Medicare Other | Attending: Family Medicine | Admitting: Family Medicine

## 2013-05-09 ENCOUNTER — Encounter (HOSPITAL_COMMUNITY): Payer: Self-pay | Admitting: Emergency Medicine

## 2013-05-09 DIAGNOSIS — Z79899 Other long term (current) drug therapy: Secondary | ICD-10-CM

## 2013-05-09 DIAGNOSIS — J189 Pneumonia, unspecified organism: Principal | ICD-10-CM | POA: Diagnosis present

## 2013-05-09 DIAGNOSIS — K2100 Gastro-esophageal reflux disease with esophagitis, without bleeding: Secondary | ICD-10-CM | POA: Diagnosis present

## 2013-05-09 DIAGNOSIS — I10 Essential (primary) hypertension: Secondary | ICD-10-CM | POA: Diagnosis present

## 2013-05-09 DIAGNOSIS — E119 Type 2 diabetes mellitus without complications: Secondary | ICD-10-CM | POA: Diagnosis present

## 2013-05-09 DIAGNOSIS — F411 Generalized anxiety disorder: Secondary | ICD-10-CM | POA: Diagnosis present

## 2013-05-09 DIAGNOSIS — G9340 Encephalopathy, unspecified: Secondary | ICD-10-CM | POA: Diagnosis present

## 2013-05-09 DIAGNOSIS — G92 Toxic encephalopathy: Secondary | ICD-10-CM | POA: Diagnosis present

## 2013-05-09 DIAGNOSIS — J4489 Other specified chronic obstructive pulmonary disease: Secondary | ICD-10-CM | POA: Diagnosis present

## 2013-05-09 DIAGNOSIS — N390 Urinary tract infection, site not specified: Secondary | ICD-10-CM | POA: Diagnosis present

## 2013-05-09 DIAGNOSIS — R0902 Hypoxemia: Secondary | ICD-10-CM | POA: Diagnosis present

## 2013-05-09 DIAGNOSIS — J449 Chronic obstructive pulmonary disease, unspecified: Secondary | ICD-10-CM | POA: Diagnosis present

## 2013-05-09 DIAGNOSIS — E785 Hyperlipidemia, unspecified: Secondary | ICD-10-CM | POA: Diagnosis present

## 2013-05-09 DIAGNOSIS — M810 Age-related osteoporosis without current pathological fracture: Secondary | ICD-10-CM | POA: Diagnosis present

## 2013-05-09 DIAGNOSIS — M069 Rheumatoid arthritis, unspecified: Secondary | ICD-10-CM | POA: Diagnosis present

## 2013-05-09 DIAGNOSIS — R519 Headache, unspecified: Secondary | ICD-10-CM | POA: Diagnosis present

## 2013-05-09 DIAGNOSIS — F05 Delirium due to known physiological condition: Secondary | ICD-10-CM | POA: Diagnosis present

## 2013-05-09 DIAGNOSIS — G929 Unspecified toxic encephalopathy: Secondary | ICD-10-CM | POA: Diagnosis present

## 2013-05-09 DIAGNOSIS — Z87891 Personal history of nicotine dependence: Secondary | ICD-10-CM

## 2013-05-09 DIAGNOSIS — Z8673 Personal history of transient ischemic attack (TIA), and cerebral infarction without residual deficits: Secondary | ICD-10-CM

## 2013-05-09 DIAGNOSIS — R51 Headache: Secondary | ICD-10-CM | POA: Diagnosis present

## 2013-05-09 HISTORY — DX: Cerebral infarction, unspecified: I63.9

## 2013-05-09 HISTORY — DX: Unspecified osteoarthritis, unspecified site: M19.90

## 2013-05-09 HISTORY — DX: Chronic obstructive pulmonary disease, unspecified: J44.9

## 2013-05-09 HISTORY — DX: Type 2 diabetes mellitus without complications: E11.9

## 2013-05-09 HISTORY — DX: Essential (primary) hypertension: I10

## 2013-05-09 HISTORY — DX: Anxiety disorder, unspecified: F41.9

## 2013-05-09 LAB — URINALYSIS, ROUTINE W REFLEX MICROSCOPIC
Ketones, ur: NEGATIVE mg/dL
Nitrite: NEGATIVE
Protein, ur: 30 mg/dL — AB
Urobilinogen, UA: 1 mg/dL (ref 0.0–1.0)

## 2013-05-09 LAB — CBC WITH DIFFERENTIAL/PLATELET
Basophils Absolute: 0.1 10*3/uL (ref 0.0–0.1)
Eosinophils Absolute: 0.1 10*3/uL (ref 0.0–0.7)
Eosinophils Relative: 1 % (ref 0–5)
HCT: 40.9 % (ref 36.0–46.0)
Lymphocytes Relative: 21 % (ref 12–46)
MCH: 31.3 pg (ref 26.0–34.0)
MCHC: 32 g/dL (ref 30.0–36.0)
MCV: 97.6 fL (ref 78.0–100.0)
Monocytes Absolute: 0.7 10*3/uL (ref 0.1–1.0)
Platelets: 188 10*3/uL (ref 150–400)
RDW: 13.7 % (ref 11.5–15.5)
WBC: 8.5 10*3/uL (ref 4.0–10.5)

## 2013-05-09 LAB — TROPONIN I: Troponin I: <0.3 ng/mL (ref <0.30)

## 2013-05-09 LAB — COMPREHENSIVE METABOLIC PANEL
AST: 30 U/L (ref 0–37)
CO2: 36 mEq/L — ABNORMAL HIGH (ref 19–32)
Calcium: 10.3 mg/dL (ref 8.4–10.5)
Creatinine, Ser: 0.68 mg/dL (ref 0.50–1.10)
GFR calc Af Amer: >90 mL/min (ref >90)
GFR calc non Af Amer: 83 mL/min — ABNORMAL LOW (ref >90)
Sodium: 139 mEq/L (ref 135–145)
Total Protein: 7.7 g/dL (ref 6.0–8.3)

## 2013-05-09 LAB — BLOOD GAS, ARTERIAL
Drawn by: 21310
pCO2 arterial: 56.7 mmHg — ABNORMAL HIGH (ref 35.0–45.0)
pH, Arterial: 7.374 (ref 7.350–7.450)

## 2013-05-09 MED ORDER — SODIUM CHLORIDE 0.45 % IV SOLN
INTRAVENOUS | Status: AC
  Administered 2013-05-10: 01:00:00 via INTRAVENOUS

## 2013-05-09 MED ORDER — ONDANSETRON HCL 4 MG/2ML IJ SOLN
4.0000 mg | Freq: Four times a day (QID) | INTRAMUSCULAR | Status: DC | PRN
  Administered 2013-05-12: 4 mg via INTRAVENOUS
  Filled 2013-05-09: qty 2

## 2013-05-09 MED ORDER — ACETAMINOPHEN 650 MG RE SUPP: 650.0000 mg | Freq: Four times a day (QID) | RECTAL | Status: DC | PRN

## 2013-05-09 MED ORDER — DEXTROSE 5 % IV SOLN
500.0000 mg | INTRAVENOUS | Status: DC
  Administered 2013-05-10: 500 mg via INTRAVENOUS
  Filled 2013-05-09: qty 500

## 2013-05-09 MED ORDER — ENOXAPARIN SODIUM 40 MG/0.4ML ~~LOC~~ SOLN
40.0000 mg | SUBCUTANEOUS | Status: DC
  Administered 2013-05-10 – 2013-05-13 (×4): 40 mg via SUBCUTANEOUS
  Filled 2013-05-09 (×4): qty 0.4

## 2013-05-09 MED ORDER — HYDRALAZINE HCL 20 MG/ML IJ SOLN
10.0000 mg | Freq: Four times a day (QID) | INTRAMUSCULAR | Status: DC | PRN
  Administered 2013-05-10 (×2): 10 mg via INTRAVENOUS
  Filled 2013-05-09 (×2): qty 1

## 2013-05-09 MED ORDER — AZITHROMYCIN 250 MG PO TABS
500.0000 mg | ORAL_TABLET | Freq: Once | ORAL | Status: AC
  Administered 2013-05-09: 500 mg via ORAL
  Filled 2013-05-09: qty 2

## 2013-05-09 MED ORDER — SODIUM CHLORIDE 0.9 % IV SOLN
Freq: Once | INTRAVENOUS | Status: AC
  Administered 2013-05-09: 21:00:00 via INTRAVENOUS

## 2013-05-09 MED ORDER — SODIUM CHLORIDE 0.9 % IJ SOLN
3.0000 mL | Freq: Two times a day (BID) | INTRAMUSCULAR | Status: DC
  Administered 2013-05-10 – 2013-05-12 (×4): 3 mL via INTRAVENOUS
  Filled 2013-05-09: qty 3

## 2013-05-09 MED ORDER — DEXTROSE 5 % IV SOLN
1.0000 g | INTRAVENOUS | Status: DC
  Administered 2013-05-10 – 2013-05-11 (×2): 1 g via INTRAVENOUS
  Filled 2013-05-09 (×4): qty 10

## 2013-05-09 MED ORDER — ACETAMINOPHEN 325 MG PO TABS
650.0000 mg | ORAL_TABLET | Freq: Four times a day (QID) | ORAL | Status: DC | PRN
  Administered 2013-05-10 (×2): 650 mg via ORAL
  Filled 2013-05-09 (×3): qty 2

## 2013-05-09 MED ORDER — ONDANSETRON HCL 4 MG PO TABS: 4.0000 mg | ORAL_TABLET | Freq: Four times a day (QID) | ORAL | Status: DC | PRN

## 2013-05-09 MED ORDER — DEXTROSE 5 % IV SOLN
1.0000 g | Freq: Once | INTRAVENOUS | Status: AC
  Administered 2013-05-09: 1 g via INTRAVENOUS
  Filled 2013-05-09: qty 10

## 2013-05-09 MED ORDER — LEVALBUTEROL HCL 0.63 MG/3ML IN NEBU
0.6300 mg | INHALATION_SOLUTION | Freq: Four times a day (QID) | RESPIRATORY_TRACT | Status: DC
  Administered 2013-05-10 – 2013-05-13 (×14): 0.63 mg via RESPIRATORY_TRACT
  Filled 2013-05-09 (×14): qty 3

## 2013-05-09 NOTE — ED Provider Notes (Addendum)
History  This chart was scribed for Molly Roller, MD by Bennett Scrape, ED Scribe. This patient was seen in room APA08/APA08 and the patient's care was started at 8:39 PM.  CSN: [id number]  Arrival date & time 05/09/13  1955   First MD Initiated Contact with Patient 05/09/13 2034      Chief Complaint  Patient presents with  . Headache  . Altered Mental Status     The history is provided by the patient and a relative. No language interpreter was used.    HPI Comments: [name] is a 78 y.o. female who presents to the Emergency Department complaining of 4-5 days of gradual onset, waxing and waning, non-radiating headache located at the top of the vertex which occasionally becomes so bad that it wakes her from sleep. She reports as an associated symptom bilateral otalgia.  Her granddaughter states that over the past few days the pt has become disorientated, fearing that people are stealing from her, and that her speech has been intermittently slurred, though it has improved today. At baseline, the pt has not slurred speech and is not disoriented. The pt denies any falls. She reports that she has been on oxygen at home for the past 3-4 years for her COPD. Granddaughter also reports that the pt was recently treated for a yeast infection in her left eye but is unable to provide more details. She denies fever, cough and emesis as associated symptoms. The pt also has a h/o CVA, HTN, and DM without complication. She is a former smoker and she denies drinking.   Pt lives with granddaughter.  Past Medical History  Diagnosis Date  . COPD (chronic obstructive pulmonary disease)   . Stroke   . Hypertension   . Anxiety   . Arthritis   . Diabetes mellitus without complication     Past Surgical History  Procedure Laterality Date  . Right hip surgery    . Esophageal dilation      History reviewed. No pertinent family history.  History  Substance Use Topics  . Smoking status: Former  Games developer  . Smokeless tobacco: Not on file  . Alcohol Use: No    No OB history provided.  Review of Systems  Constitutional: Negative for fever.  HENT: Positive for ear pain. Negative for sore throat.   Respiratory: Positive for shortness of breath (chronic).   Neurological: Positive for speech difficulty and headaches. Negative for weakness.  Psychiatric/Behavioral: Positive for confusion.  All other systems reviewed and are negative.    Allergies  Morphine and related  Home Medications   Current Outpatient Rx  Name  Route  Sig  Dispense  Refill  . ALPRAZolam (XANAX) 1 MG tablet   Oral   Take 1 mg by mouth 3 (three) times daily.           BP 188/101  Pulse 114  Temp(Src) 97.6 F (36.4 C) (Oral)  Resp 18  Ht 4\' 9"  (1.448 m)  Wt 140 lb (63.504 kg)  BMI 30.29 kg/m2  SpO2 93%  Physical Exam  Nursing note and vitals reviewed. Constitutional: She is oriented to person, place, and time. She appears well-developed and well-nourished. No distress.  HENT:  Head: Normocephalic and atraumatic.  Nose: Nose normal.  Mouth/Throat: Oropharynx is clear and moist.  Eyes: EOM are normal.  Right pupil and left pupil abnormally shaped.   Neck: Normal range of motion. Neck supple. No tracheal deviation present. No thyromegaly present.  Cardiovascular: Regular rhythm  and normal heart sounds.   Pt is tachycardic.   Pulmonary/Chest: Effort normal. No respiratory distress. She has no wheezes. She has rales.  Pt has rales at left base. There are no wheezing and she is not in respiratory distress.   Abdominal: Soft. There is no tenderness.  Genitourinary: Vagina normal.  Chaperone present. External genitalia exam normal.   Musculoskeletal: Normal range of motion. She exhibits no edema.  No edema bilaterally on legs.   Lymphadenopathy:    She has no cervical adenopathy.  Neurological: She is alert and oriented to person, place, and time.  The patient has a normal mental status at  this time, she follows commands without difficulty, she is not hallucinating, she has normal strength of her arms and legs and no obvious cranial nerve deficits.  Skin: Skin is warm and dry.  Psychiatric: She has a normal mood and affect. Her behavior is normal.    ED Course  Procedures (including critical care time)  DIAGNOSTIC STUDIES: Oxygen Saturation is 93% on Pulcifer, low by my interpretation.    COORDINATION OF CARE:  8:47 PM- Discussed treatment plan with pt which includes labs and tests. Pt agreed to plan.    Labs Reviewed  COMPREHENSIVE METABOLIC PANEL - Abnormal; Notable for the following:    CO2 36 (*)    Glucose, Bld 178 (*)    Total Bilirubin 0.2 (*)    GFR calc non Af Amer 83 (*)    All other components within normal limits  BLOOD GAS, ARTERIAL - Abnormal; Notable for the following:    pCO2 arterial 56.7 (*)    Bicarbonate 32.3 (*)    Acid-Base Excess 7.1 (*)    All other components within normal limits  URINALYSIS, ROUTINE W REFLEX MICROSCOPIC - Abnormal; Notable for the following:    Protein, ur 30 (*)    Leukocytes, UA TRACE (*)    All other components within normal limits  URINE MICROSCOPIC-ADD ON - Abnormal; Notable for the following:    Squamous Epithelial / LPF FEW (*)    Bacteria, UA FEW (*)    All other components within normal limits  URINE CULTURE  CBC WITH DIFFERENTIAL   Ct Head Wo Contrast  05/09/2013   *RADIOLOGY REPORT*  Clinical Data: Headaches, confusion  CT HEAD WITHOUT CONTRAST  Technique:  Contiguous axial images were obtained from the base of the skull through the vertex without contrast.  Comparison: None.  Findings: No acute intracranial hemorrhage.  No focal mass lesion. No CT evidence of acute infarction.   No midline shift or mass effect.  No hydrocephalus.  Basilar cisterns are patent.  There is high density thickening along the interhemispheric fissure superiorly which is felt to represent a benign dural calcification (image 29).  There is  generalized cortical atrophy.  There is extensive periventricular subcortical white matter hypodensities. Paranasal sinuses and mastoid air cells are clear.  Orbits are normal.  IMPRESSION:  1.  No acute intracranial findings. 2.  Atrophy and white matter microvascular disease.   Original Report Authenticated By: Genevive Bi, M.D.   Dg Chest Port 1 View  05/09/2013   *RADIOLOGY REPORT*  Clinical Data: Shortness of breath, weakness, dizziness and headache.  Altered mental status.  PORTABLE CHEST - 1 VIEW  Comparison: Chest radiograph performed 01/15/2011  Findings: The lungs are well-aerated.  Left basilar airspace opacification raises concern for mild pneumonia.  There is no evidence of pleural effusion or pneumothorax.  Mild chronic lung changes are seen, with  mild chronic peribronchial thickening.  The cardiomediastinal silhouette is borderline normal in size. Calcification is noted in the aortic arch.  No acute osseous abnormalities are seen.  IMPRESSION: Left basilar airspace opacification raises concern for mild pneumonia.   Original Report Authenticated By: Tonia Ghent, M.D.     1. CAP (community acquired pneumonia)   2. UTI (lower urinary tract infection)       MDM  I discussed the patient's care with the hospitalist who agrees that the patient should be admitted to the hospital. I have personally interpreted her chest x-ray which appears to have a left lower lobe pneumonia. This is a community-acquired pneumonia, she has no leukocytosis but does have hypoxia and mild hypercapnia though I suspect she has a carbon dioxide retainer given her ABG and COPD history.  She is requiring oxygen supplementation, IV antibiotics and cardiac monitoring. She will be admitted to a telemetry bed with IV antibiotics.  ED ECG REPORT  I personally interpreted this EKG   Date: 05/09/2013   Rate: 101  Rhythm: sinus tachycardia  QRS Axis: normal  Intervals: normal  ST/T Wave abnormalities: normal   Conduction Disutrbances:none  Narrative Interpretation:   Old EKG Reviewed: none available   Meds given in ED:  Medications  cefTRIAXone (ROCEPHIN) 1 g in dextrose 5 % 50 mL IVPB (1 g Intravenous New Bag/Given 05/09/13 2201)  azithromycin (ZITHROMAX) tablet 500 mg (not administered)  0.9 %  sodium chloride infusion ( Intravenous New Bag/Given 05/09/13 2100)    New Prescriptions   No medications on file      I personally performed the services described in this documentation, which was scribed in my presence. The recorded information has been reviewed and is accurate.         Molly Roller, MD 05/09/13 1610  Molly Roller, MD 05/09/13 2239

## 2013-05-09 NOTE — ED Notes (Addendum)
Patient states she has had a headache x 3 days. Family member states patient has been disoriented and confused x 2 weeks. Patient alert and oriented at triage. Answers questions appropriately.

## 2013-05-09 NOTE — H&P (Addendum)
[facility] History and Physical  [name] ZOX:[id number] DOB: Sep 15, 1935 DOA: 05/09/2013   PCP: Milana Obey, MD  Specialists: None  Chief Complaint: Confusion with cough for the past one to 2 weeks  HPI: [name] is a 78 y.o. female with a past medical history of COPD, on home oxygen, history of osteoporosis, rheumatoid arthritis, who was in her usual state of health about 2 weeks ago, when her granddaughter noticed that the patient was getting confused. She appeared to be disoriented. She started having some visual hallucinations and slurred speech at times. Denies any focal weakness. Has had a headache with dizziness at times. She also has been having a cough with greenish expectoration. Denies any blood in the sputum. No shortness of breath per se, but has been having some chest tightness at times. No fever, but has been having chills and has had poor appetite. Was nauseous 2 days ago. She's had multiple lung infections in the past and has been on steroids many times in the past. Denies any history of falls.  Home Medications: Prior to Admission medications   Medication Sig Start Date End Date Taking? Authorizing Provider  ALPRAZolam Prudy Feeler) 1 MG tablet Take 1 mg by mouth 3 (three) times daily.   Yes Historical Provider, MD    Allergies:  Allergies  Allergen Reactions  . Morphine And Related     Past Medical History: Past Medical History  Diagnosis Date  . COPD (chronic obstructive pulmonary disease)   . Stroke   . Hypertension   . Anxiety   . Arthritis   . Diabetes mellitus without complication     Past Surgical History  Procedure Laterality Date  . Right hip surgery    . Esophageal dilation      Social History:  reports that she has quit smoking. She does not have any smokeless tobacco history on file. She reports that she does not drink alcohol or use illicit drugs.  Living Situation: She lives in [place] and her granddaughters stay with  her Activity Level: Uses a cane to ambulate. Has used a walker in the past   Family History: history of cancer, hypertension, and depression runs in the family  Review of Systems - Unable to do due to confusion  Physical Examination  Filed Vitals:   05/09/13 2024 05/09/13 2158  BP: 188/101   Pulse: 114   Temp: 97.6 F (36.4 C) 97.6 F (36.4 C)  TempSrc: Oral   Resp: 18   Height: 4\' 9"  (1.448 m)   Weight: 63.504 kg (140 lb)   SpO2: 93%     General appearance: alert, cooperative, appears stated age, distracted and no distress Head: Normocephalic, without obvious abnormality, atraumatic Eyes: She status post surgeries in the past. Pupils did not react to light and more. Throat: lips, mucosa, and tongue normal; teeth and gums normal Neck: no adenopathy, no carotid bruit, no JVD, supple, symmetrical, trachea midline and thyroid not enlarged, symmetric, no tenderness/mass/nodules Back: symmetric, no curvature. ROM normal. No CVA tenderness. Resp: Chest crackles bilateral bases, more so on the left base. No wheezing is present. Cardio: regular rate and rhythm, S1, S2 normal, no murmur, click, rub or gallop GI: soft, non-tender; bowel sounds normal; no masses,  no organomegaly Extremities: extremities normal, atraumatic, no cyanosis or edema Pulses: 2+ and symmetric Skin: Skin color, texture, turgor normal. No rashes or lesions Lymph nodes: Cervical, supraclavicular, and axillary nodes normal. Neurologic: She is alert. She was oriented to place, year. Did  not know the month. Oriented to person. Knew her own Date of birth. Cranial nerves are intact. No focal neurological deficits otherwise present. Motor strength is equal bilaterally. Reflexes are equal 1+.  Laboratory Data: Results for orders placed during the hospital encounter of 05/09/13 (from the past 48 hour(s))  CBC WITH DIFFERENTIAL     Status: None   Collection Time    05/09/13  9:16 PM      Result Value Range   WBC 8.5   4.0 - 10.5 K/uL   RBC 4.19  3.87 - 5.11 MIL/uL   Hemoglobin 13.1  12.0 - 15.0 g/dL   HCT 16.1  09.6 - 04.5 %   MCV 97.6  78.0 - 100.0 fL   MCH 31.3  26.0 - 34.0 pg   MCHC 32.0  30.0 - 36.0 g/dL   RDW 40.9  81.1 - 91.4 %   Platelets 188  150 - 400 K/uL   Neutrophils Relative % 69  43 - 77 %   Neutro Abs 5.9  1.7 - 7.7 K/uL   Lymphocytes Relative 21  12 - 46 %   Lymphs Abs 1.8  0.7 - 4.0 K/uL   Monocytes Relative 8  3 - 12 %   Monocytes Absolute 0.7  0.1 - 1.0 K/uL   Eosinophils Relative 1  0 - 5 %   Eosinophils Absolute 0.1  0.0 - 0.7 K/uL   Basophils Relative 1  0 - 1 %   Basophils Absolute 0.1  0.0 - 0.1 K/uL  COMPREHENSIVE METABOLIC PANEL     Status: Abnormal   Collection Time    05/09/13  9:16 PM      Result Value Range   Sodium 139  135 - 145 mEq/L   Potassium 3.5  3.5 - 5.1 mEq/L   Chloride 96  96 - 112 mEq/L   CO2 36 (*) 19 - 32 mEq/L   Glucose, Bld 178 (*) 70 - 99 mg/dL   BUN 9  6 - 23 mg/dL   Creatinine, Ser 7.82  0.50 - 1.10 mg/dL   Calcium 95.6  8.4 - 21.3 mg/dL   Total Protein 7.7  6.0 - 8.3 g/dL   Albumin 4.0  3.5 - 5.2 g/dL   AST 30  0 - 37 U/L   ALT 16  0 - 35 U/L   Alkaline Phosphatase 102  39 - 117 U/L   Total Bilirubin 0.2 (*) 0.3 - 1.2 mg/dL   GFR calc non Af Amer 83 (*) >90 mL/min   GFR calc Af Amer >90  >90 mL/min   Comment:            The eGFR has been calculated     using the CKD EPI equation.     This calculation has not been     validated in all clinical     situations.     eGFR's persistently     <90 mL/min signify     possible Chronic Kidney Disease.  BLOOD GAS, ARTERIAL     Status: Abnormal   Collection Time    05/09/13  9:20 PM      Result Value Range   O2 Content 2.0     Delivery systems NASAL CANNULA     pH, Arterial 7.374  7.350 - 7.450   pCO2 arterial 56.7 (*) 35.0 - 45.0 mmHg   pO2, Arterial 84.4  80.0 - 100.0 mmHg   Bicarbonate 32.3 (*) 20.0 - 24.0 mEq/L   TCO2 29.1  0 - 100 mmol/L   Acid-Base Excess 7.1 (*) 0.0 - 2.0 mmol/L    O2 Saturation 96.0     Collection site RIGHT RADIAL     Drawn by [id number]     Sample type ARTERIAL     Allens test (pass/fail) PASS  PASS  URINALYSIS, ROUTINE W REFLEX MICROSCOPIC     Status: Abnormal   Collection Time    05/09/13  9:22 PM      Result Value Range   Color, Urine YELLOW  YELLOW   APPearance CLEAR  CLEAR   Specific Gravity, Urine 1.020  1.005 - 1.030   pH 6.0  5.0 - 8.0   Glucose, UA NEGATIVE  NEGATIVE mg/dL   Hgb urine dipstick NEGATIVE  NEGATIVE   Bilirubin Urine NEGATIVE  NEGATIVE   Ketones, ur NEGATIVE  NEGATIVE mg/dL   Protein, ur 30 (*) NEGATIVE mg/dL   Urobilinogen, UA 1.0  0.0 - 1.0 mg/dL   Nitrite NEGATIVE  NEGATIVE   Leukocytes, UA TRACE (*) NEGATIVE  URINE MICROSCOPIC-ADD ON     Status: Abnormal   Collection Time    05/09/13  9:22 PM      Result Value Range   Squamous Epithelial / LPF FEW (*) RARE   WBC, UA 11-20  <3 WBC/hpf   RBC / HPF 3-6  <3 RBC/hpf   Bacteria, UA FEW (*) RARE    Radiology Reports: Ct Head Wo Contrast  05/09/2013   *RADIOLOGY REPORT*  Clinical Data: Headaches, confusion  CT HEAD WITHOUT CONTRAST  Technique:  Contiguous axial images were obtained from the base of the skull through the vertex without contrast.  Comparison: None.  Findings: No acute intracranial hemorrhage.  No focal mass lesion. No CT evidence of acute infarction.   No midline shift or mass effect.  No hydrocephalus.  Basilar cisterns are patent.  There is high density thickening along the interhemispheric fissure superiorly which is felt to represent a benign dural calcification (image 29).  There is generalized cortical atrophy.  There is extensive periventricular subcortical white matter hypodensities. Paranasal sinuses and mastoid air cells are clear.  Orbits are normal.  IMPRESSION:  1.  No acute intracranial findings. 2.  Atrophy and white matter microvascular disease.   Original Report Authenticated By: Genevive Bi, M.D.   Dg Chest Port 1 View  05/09/2013    *RADIOLOGY REPORT*  Clinical Data: Shortness of breath, weakness, dizziness and headache.  Altered mental status.  PORTABLE CHEST - 1 VIEW  Comparison: Chest radiograph performed 01/15/2011  Findings: The lungs are well-aerated.  Left basilar airspace opacification raises concern for mild pneumonia.  There is no evidence of pleural effusion or pneumothorax.  Mild chronic lung changes are seen, with mild chronic peribronchial thickening.  The cardiomediastinal silhouette is borderline normal in size. Calcification is noted in the aortic arch.  No acute osseous abnormalities are seen.  IMPRESSION: Left basilar airspace opacification raises concern for mild pneumonia.   Original Report Authenticated By: Tonia Ghent, M.D.    Electrocardiogram: Pending  Problem List  Principal Problem:   CAP (community acquired pneumonia) Active Problems:   Acute encephalopathy   UTI (lower urinary tract infection)   COPD (chronic obstructive pulmonary disease)   Headache   Assessment: This is a 78 year old, Caucasian female, with a history of COPD, who comes in with confusion. Does not have any focal findings on examination. She does have pneumonia based on chest x-ray and her UA is abnormal, suggestive of UTI. Her confusion is  most likely due to the infections.  Plan: #1 acute encephalopathy: Most likely due to UTI or pneumonia. CT head did not show any acute changes. Her neurological examination is nonfocal. Continue to monitor. Avoid any medications that may make her condition worse.  #2 community-acquired pneumonia: Continue with ceftriaxone and azithromycin. Her chest tightness is most likely secondary to the pneumonia. We will check a troponin. EKG is pending.  #3 history of COPD: Utilize nebulizer treatments every 6 hours. Oxygen at 2 L per minute. At this time there is no clear indication for steroids as she is not wheezing and appears to be quite comfortable.  #4 UTI: Await urine cultures. Continue  with antibiotics for now.  Pharmacy to obtain her home medication list.  DVT Prophylaxis: Lovenox Code Status: She is a full code Family Communication: Discussed with the patient and her granddaughter  Disposition Plan: Admit to telemetry. Dr. [name] will assume care in the morning.  Further management decisions will depend on results of further testing and patient's response to treatment.  [facility]  [facility] Pager [phone]  If 7PM-7AM, please contact night-coverage [email or web address] Password [email or web address]  05/09/2013, 10:20 PM

## 2013-05-10 LAB — COMPREHENSIVE METABOLIC PANEL
ALT: 15 U/L (ref 0–35)
AST: 27 U/L (ref 0–37)
Alkaline Phosphatase: 94 U/L (ref 39–117)
CO2: 35 mEq/L — ABNORMAL HIGH (ref 19–32)
Calcium: 9.6 mg/dL (ref 8.4–10.5)
Chloride: 102 mEq/L (ref 96–112)
GFR calc Af Amer: >90 mL/min (ref >90)
GFR calc non Af Amer: 86 mL/min — ABNORMAL LOW (ref >90)
Glucose, Bld: 120 mg/dL — ABNORMAL HIGH (ref 70–99)
Potassium: 3.5 mEq/L (ref 3.5–5.1)
Sodium: 143 mEq/L (ref 135–145)
Total Bilirubin: 0.2 mg/dL — ABNORMAL LOW (ref 0.3–1.2)

## 2013-05-10 LAB — CBC
Hemoglobin: 12.8 g/dL (ref 12.0–15.0)
MCH: 31.4 pg (ref 26.0–34.0)
MCHC: 32.2 g/dL (ref 30.0–36.0)
RDW: 13.9 % (ref 11.5–15.5)

## 2013-05-10 LAB — TSH: TSH: 0.968 u[IU]/mL (ref 0.350–4.500)

## 2013-05-10 LAB — VITAMIN B12: Vitamin B-12: 443 pg/mL (ref 211–911)

## 2013-05-10 MED ORDER — ALPRAZOLAM 0.25 MG PO TABS
0.2500 mg | ORAL_TABLET | Freq: Every evening | ORAL | Status: DC | PRN
  Administered 2013-05-10: 0.25 mg via ORAL
  Filled 2013-05-10: qty 1

## 2013-05-10 NOTE — Progress Notes (Signed)
ANTIBIOTIC CONSULT NOTE - INITIAL  Pharmacy Consult for Zithromax and Rocephin Indication: pneumonia  Allergies  Allergen Reactions  . Morphine And Related     Patient Measurements: Height: 4\' 9"  (144.8 cm) Weight: 135 lb 11.2 oz (61.553 kg) IBW/kg (Calculated) : 38.6  Vital Signs: Temp: 97.7 F (36.5 C) (05/26 0622) Temp src: Oral (05/26 0622) BP: 165/71 mmHg (05/26 0622) Pulse Rate: 104 (05/26 0622) Intake/Output from previous day: 05/25 0701 - 05/26 0700 In: 1250 [I.V.:1200; IV Piggyback:50] Out: 1200 [Urine:1200] Intake/Output from this shift:    Labs:  Recent Labs  05/09/13 2116 05/10/13 0552  WBC 8.5 7.5  HGB 13.1 12.8  PLT 188 181  CREATININE 0.68 0.60   Estimated Creatinine Clearance: 45.1 ml/min (by C-G formula based on Cr of 0.6). No results found for this basename: VANCOTROUGH, VANCOPEAK, VANCORANDOM, GENTTROUGH, GENTPEAK, GENTRANDOM, TOBRATROUGH, TOBRAPEAK, TOBRARND, AMIKACINPEAK, AMIKACINTROU, AMIKACIN,  in the last 72 hours   Microbiology: No results found for this or any previous visit (from the past 720 hour(s)).  Medical History: Past Medical History  Diagnosis Date  . COPD (chronic obstructive pulmonary disease)   . Stroke   . Hypertension   . Anxiety   . Arthritis   . Diabetes mellitus without complication     Medications:  Scheduled:  . azithromycin  500 mg Intravenous Q24H  . cefTRIAXone (ROCEPHIN)  IV  1 g Intravenous Q24H  . enoxaparin (LOVENOX) injection  40 mg Subcutaneous Q24H  . levalbuterol  0.63 mg Nebulization Q6H  . sodium chloride  3 mL Intravenous Q12H   Assessment: 78yo female admitted with suspected.  Started on Zithromax and Rocephin.  Goal of Therapy:  Eradicate infection.  Plan:  Continue current Rx.  No adjustment needed.  Margo Aye, Iretta Mangrum A 05/10/2013,11:32 AM

## 2013-05-10 NOTE — Evaluation (Signed)
Physical Therapy Evaluation Patient Details Name: [name] MRN: [id number] DOB: 09/29/1935 Today's Date: 05/10/2013 Time: 4098-1191 PT Time Calculation (min): 22 min  PT Assessment / Plan / Recommendation Clinical Impression  Pt was seen for evaluation and found to be at prior functional level.  She has excellent family support and ambulates with a quad cane at home.  She should not have any PT needs.    PT Assessment  Patent does not need any further PT services    Follow Up Recommendations  No PT follow up    Does the patient have the potential to tolerate intense rehabilitation      Barriers to Discharge        Equipment Recommendations  None recommended by PT    Recommendations for Other Services     Frequency      Precautions / Restrictions Precautions Precautions: None Restrictions Weight Bearing Restrictions: No   Pertinent Vitals/Pain       Mobility  Bed Mobility Bed Mobility: Supine to Sit;Sit to Supine Supine to Sit: 6: Modified independent (Device/Increase time);HOB flat Sit to Supine: 6: Modified independent (Device/Increase time);HOB flat Transfers Transfers: Sit to Stand;Stand to Sit Sit to Stand: 6: Modified independent (Device/Increase time);From bed;With upper extremity assist Stand to Sit: 6: Modified independent (Device/Increase time);To bed;With upper extremity assist Ambulation/Gait Ambulation/Gait Assistance: 6: Modified independent (Device/Increase time) Ambulation Distance (Feet): 30 Feet Assistive device: Large base quad cane Ambulation/Gait Assistance Details: quad cane is a bit tall for her and she places it in front of her rather than out to the side, however she is not willing to make any changes to this Gait Pattern: Within Functional Limits Gait velocity: WNL Stairs: No Wheelchair Mobility Wheelchair Mobility: No    Exercises     PT Diagnosis:    PT Problem List:   PT Treatment Interventions:     PT Goals    Visit  Information  Last PT Received On: 05/10/13    Subjective Data  Subjective: I'm feeling much better Patient Stated Goal: return home   Prior Functioning  Home Living Lives With: Family Available Help at Discharge: Family;Available 24 hours/day Type of Home: House Home Access: Stairs to enter (always has assist for steps) Entrance Stairs-Number of Steps: 2 Entrance Stairs-Rails: Right Home Layout: One level Bathroom Shower/Tub: Engineer, manufacturing systems: Standard Home Adaptive Equipment: Straight cane;Walker - rolling;Bedside commode/3-in-1;Shower chair without back Prior Function Level of Independence: Independent with assistive device(s) Able to Take Stairs?: Yes Driving: No Vocation: 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, advertising Arousal/Alertness: Awake/alert Behavior During Therapy: WFL for tasks assessed/performed Overall Cognitive Status: Within Functional Limits for tasks assessed    Extremity/Trunk Assessment Right Lower Extremity Assessment RLE ROM/Strength/Tone: WFL for tasks assessed RLE Sensation: WFL - Light Touch RLE Coordination: WFL - gross motor Left Lower Extremity Assessment LLE ROM/Strength/Tone: WFL for tasks assessed LLE Sensation: WFL - Light Touch LLE Coordination: WFL - gross motor Trunk Assessment Trunk Assessment: Kyphotic   Balance Balance Balance Assessed: No  End of Session PT - End of Session Equipment Utilized During Treatment: Gait belt;Oxygen Activity Tolerance: Patient tolerated treatment well Patient left: in bed;with call bell/phone within reach;with family/visitor present  GP     Myrlene Broker L 05/10/2013, 12:12 PM

## 2013-05-10 NOTE — Progress Notes (Signed)
[name], [name]                 ACCOUNT NO.:  [id number]  MEDICAL RECORD NO.:  [id number]  LOCATION:  A321                          FACILITY:  [facility]  PHYSICIAN:  Mila Homer. [name], M.D.DATE OF BIRTH:  July 06, 1935  DATE OF PROCEDURE: DATE OF DISCHARGE:                                PROGRESS NOTE   SUBJECTIVE:  Patient was admitted with some confusion and community- acquired pneumonia.  She feels better today.  OBJECTIVE:  VITAL SIGNS:  Temperature is 97.7, pulse 104, respiratory rate 20, blood pressure 165/71. GENERAL:  She is sitting up in bed, talking.  She knows me, is able to converse intelligently. LUNGS:  Her lungs show decreased breath sounds throughout. HEART:  Regular rhythm, rate of about 100.  There is no edema.  Patient's blood gases showed a pH of 7.37, pCO2 of 56, pO2 of 84.  Her white cell count is 8500, now 7500.  Her chest x-ray showed some left basilar air space opacification with question of mild pneumonia.  ASSESSMENT: 1. Confusion, probably secondary to pneumonia. 2. Pneumonia. 3. Chronic obstructive pulmonary disease. 4. Rheumatoid arthritis.  PLAN:  Continue with IV antibiotics, currently with azithromycin and ceftriaxone IV.     Mila Homer. [name], M.D.     SDK/MEDQ  D:  05/10/2013  T:  05/10/2013  Job:  [id number]

## 2013-05-11 ENCOUNTER — Inpatient Hospital Stay (HOSPITAL_COMMUNITY): Payer: Medicare Other

## 2013-05-11 LAB — URINE CULTURE

## 2013-05-11 LAB — RPR: RPR Ser Ql: NONREACTIVE

## 2013-05-11 MED ORDER — METHOTREXATE 2.5 MG PO TABS: 17.5000 mg | ORAL_TABLET | ORAL | Status: DC

## 2013-05-11 MED ORDER — HYDROCODONE-ACETAMINOPHEN 10-325 MG PO TABS
2.0000 | ORAL_TABLET | Freq: Four times a day (QID) | ORAL | Status: DC | PRN
  Administered 2013-05-11 – 2013-05-13 (×3): 2 via ORAL
  Filled 2013-05-11 (×3): qty 2

## 2013-05-11 MED ORDER — ALBUTEROL SULFATE HFA 108 (90 BASE) MCG/ACT IN AERS: 2.0000 | INHALATION_SPRAY | Freq: Four times a day (QID) | RESPIRATORY_TRACT | Status: DC | PRN

## 2013-05-11 MED ORDER — AMLODIPINE BESYLATE 5 MG PO TABS
10.0000 mg | ORAL_TABLET | Freq: Every day | ORAL | Status: DC
  Administered 2013-05-11 – 2013-05-13 (×3): 10 mg via ORAL
  Filled 2013-05-11 (×3): qty 2

## 2013-05-11 MED ORDER — SIMVASTATIN 20 MG PO TABS
40.0000 mg | ORAL_TABLET | Freq: Every evening | ORAL | Status: DC
  Administered 2013-05-11 – 2013-05-12 (×2): 40 mg via ORAL
  Filled 2013-05-11 (×2): qty 2

## 2013-05-11 MED ORDER — PANTOPRAZOLE SODIUM 40 MG PO TBEC
40.0000 mg | DELAYED_RELEASE_TABLET | Freq: Every day | ORAL | Status: DC
  Administered 2013-05-11 – 2013-05-13 (×3): 40 mg via ORAL
  Filled 2013-05-11 (×3): qty 1

## 2013-05-11 MED ORDER — BENAZEPRIL HCL 10 MG PO TABS
20.0000 mg | ORAL_TABLET | Freq: Every day | ORAL | Status: DC
  Administered 2013-05-11 – 2013-05-13 (×3): 20 mg via ORAL
  Filled 2013-05-11 (×3): qty 2

## 2013-05-11 MED ORDER — ALPRAZOLAM 1 MG PO TABS
1.0000 mg | ORAL_TABLET | Freq: Four times a day (QID) | ORAL | Status: DC | PRN
  Administered 2013-05-11 – 2013-05-13 (×4): 1 mg via ORAL
  Filled 2013-05-11 (×4): qty 1

## 2013-05-11 MED ORDER — ALPRAZOLAM 1 MG PO TABS: 1.0000 mg | ORAL_TABLET | Freq: Four times a day (QID) | ORAL | Status: DC

## 2013-05-11 MED ORDER — VITAMIN D (ERGOCALCIFEROL) 1.25 MG (50000 UNIT) PO CAPS: 50000.0000 [IU] | ORAL_CAPSULE | ORAL | Status: DC

## 2013-05-11 MED ORDER — AZITHROMYCIN 250 MG PO TABS
500.0000 mg | ORAL_TABLET | Freq: Every day | ORAL | Status: DC
  Administered 2013-05-11 – 2013-05-12 (×2): 500 mg via ORAL
  Filled 2013-05-11 (×2): qty 2

## 2013-05-11 MED ORDER — DULOXETINE HCL 60 MG PO CPEP
60.0000 mg | ORAL_CAPSULE | Freq: Every day | ORAL | Status: DC
  Administered 2013-05-11 – 2013-05-13 (×3): 60 mg via ORAL
  Filled 2013-05-11 (×3): qty 1

## 2013-05-11 NOTE — Progress Notes (Signed)
Occupational Therapy Screen  OT orders received. Patient's chart reviewed. Per PT eval, patient is functioning at baseline and does not require OT services at this time; will sign off.   Limmie Patricia, OTR/L,CBIS  05/11/13 11:02AM

## 2013-05-11 NOTE — Progress Notes (Signed)
UR chart review completed.  

## 2013-05-11 NOTE — Progress Notes (Signed)
Molly, [name]                 ACCOUNT NO.:  [id number]  MEDICAL RECORD NO.:  [id number]  LOCATION:  A321                          FACILITY:  [facility]  PHYSICIAN:  Mila Homer. [name], M.D.DATE OF BIRTH:  July 07, 1935  DATE OF PROCEDURE: DATE OF DISCHARGE:                                PROGRESS NOTE   SUBJECTIVE:  She is feeling somewhat better.  Family members are present and tell me, with the patient's help, that she really was sick for about a week before coming to the hospital.  She had been having fever and chills, and also coughing up more sputum than is normal for her.  The evening she was brought to the hospital she was very confused.  Her family had taken her to the grocery store but then brought her to the emergency room when the confusion persisted.  OBJECTIVE:  VITAL SIGNS:  Temperature 98.5, pulse 106, respiratory rate 20, blood pressure 187/63 and O2 sats 97%. GENERAL:  She is sitting up in bed.  Again, she has 2 family members with her. LUNGS:  Her lungs show decreased breath sounds throughout but she is moving air well. HEART:  Regular rhythm.  Rate of about 110.  ASSESSMENT: 1. Delirium secondary to pneumonia. 2. Community acquired pneumonia. 3. Chronic obstructive pulmonary disease. 4. Benign essential hypertension. 5. Reflux esophagitis. 6. Hyperlipidemia. 7. Rheumatoid arthritis.  PLAN:  Continue ceftriaxone and azithromycin IV.  A repeat chest x-ray is pending.  I renewed all her regular medications today including pantoprazole, simvastatin, amlodipine/benazepril and albuterol.    Mila Homer. [name], M.D.    SDK/MEDQ  D:  05/11/2013  T:  05/11/2013  Job:  [id number]

## 2013-05-11 NOTE — Progress Notes (Signed)
PHARMACIST - PHYSICIAN COMMUNICATION DR:   [name] CONCERNING: Antibiotic IV to Oral Route Change Policy  RECOMMENDATION: This patient is receiving Zithromax by the intravenous route.  Based on criteria approved by the Pharmacy and Therapeutics Committee, the antibiotic(s) is/are being converted to the equivalent oral dose form(s).   DESCRIPTION: These criteria include:  Patient being treated for a respiratory tract infection, urinary tract infection, or cellulitis  The patient is not neutropenic and does not exhibit a GI malabsorption state  The patient is eating (either orally or via tube) and/or has been taking other orally administered medications for a least 24 hours  The patient is improving clinically and has a Tmax < 100.5  If you have questions about this conversion, please contact the Pharmacy Department  [x]   [phone] )  [facility] []   [phone] )  [facility]  []   [phone] )  [facility] []   [phone] )  [facility]   Do not anticipate further renal dose adjustments needed.  Pharmacy will sign off.   Junita Push, PharmD, BCPS 05/11/2013@11 :11 AM

## 2013-05-11 NOTE — Care Management Note (Signed)
    Page 1 of 1   05/13/2013     2:52:26 PM   CARE MANAGEMENT NOTE 05/13/2013  Patient:  [name], [name]   Account Number:  [id number]  Date Initiated:  05/11/2013  Documentation initiated by:  Sharrie Rothman  Subjective/Objective Assessment:   Pt admitted from home with UTI and pneumonia. Pt has a granddaughter who lives with her and is her CAP aide. Pt has home O2, cane, walker, BSC, and shower chair in the home. No HH at present.     Action/Plan:   Will follow for [facility] needs. No DME needs anticipated.   Anticipated DC Date:  05/14/2013   Anticipated DC Plan:  HOME W HOME HEALTH SERVICES      DC Planning Services  CM consult      Choice offered to / List presented to:             Status of service:  Completed, signed off Medicare Important Message given?  YES (If response is "NO", the following Medicare IM given date fields will be blank) Date Medicare IM given:  05/13/2013 Date Additional Medicare IM given:    Discharge Disposition:  HOME/SELF CARE  Per UR Regulation:    If discussed at Long Length of Stay Meetings, dates discussed:    Comments:  05/13/13 1450 Arlyss Queen, RN BSN CM Pt discharged home today. No new CM needs noted. 05/11/13 1315 Arlyss Queen, RN BSN CM

## 2013-05-12 NOTE — Evaluation (Signed)
Physical Therapy Evaluation Patient Details Name: [name] MRN: [id number] DOB: 24-Mar-1935 Today's Date: 05/12/2013 Time: 4098-1191 PT Time Calculation (min): 37 min  PT Assessment / Plan / Recommendation Clinical Impression  Pt was seen for a second evaluation by a different therapist.  Pt is mod I with all aspects, has good family support and does not need skilled therapy at this time.  Would recommedn Nursing staff  to ambulate pt daily    PT Assessment  Patent does not need any further PT services    Follow Up Recommendations  No PT follow up    Does the patient have the potential to tolerate intense rehabilitation    n/a  Barriers to Discharge   none       Equipment Recommendations  None recommended by PT    Recommendations for Other Services   none  Frequency   n/a   Precautions / Restrictions Precautions Precautions: None Restrictions Weight Bearing Restrictions: No   Pertinent Vitals/Pain 0/10      Mobility  Bed Mobility Bed Mobility: Supine to Sit;Sit to Supine Supine to Sit: 6: Modified independent (Device/Increase time);HOB flat Sit to Supine: 6: Modified independent (Device/Increase time);HOB flat Transfers Transfers: Sit to Stand;Stand to Sit Sit to Stand: 6: Modified independent (Device/Increase time);From bed;With upper extremity assist Stand to Sit: 6: Modified independent (Device/Increase time);To bed;With upper extremity assist Ambulation/Gait Ambulation/Gait Assistance: 6: Modified independent (Device/Increase time) Ambulation Distance (Feet): 180 Feet Assistive device: Rolling walker Gait Pattern: Within Functional Limits Gait velocity: WNL    Exercises General Exercises - Lower Extremity Long Arc Quad: Both;10 reps Heel Slides: Both;10 reps Hip ABduction/ADduction: Both;10 reps Straight Leg Raises: Both;10 reps Hip Flexion/Marching: Both;10 reps;Seated   PT Diagnosis:   none PT Problem List:   PT Treatment Interventions:         Visit Information  Last PT Received On: 05/12/13    Subjective Data  Subjective: I'm doing well; just lonely   Prior Functioning  Home Living Lives With: Family Available Help at Discharge: Family;Available 24 hours/day Type of Home: House Home Access: Stairs to enter [employer] of Steps: 2 Entrance Stairs-Rails: Right Bathroom Shower/Tub: Engineer, manufacturing systems: Standard Home Adaptive Equipment: Straight cane;Walker - rolling;Bedside commode/3-in-1;Shower chair without back Prior Function Level of Independence: Independent with assistive device(s)    Cognition  Cognition Arousal/Alertness: Awake/alert Behavior During Therapy: WFL for tasks assessed/performed Overall Cognitive Status: Within Functional Limits for tasks assessed    Extremity/Trunk Assessment Right Lower Extremity Assessment RLE ROM/Strength/Tone: WFL for tasks assessed RLE Sensation: WFL - Light Touch RLE Coordination: WFL - gross motor Left Lower Extremity Assessment LLE ROM/Strength/Tone: WFL for tasks assessed LLE Sensation: WFL - Light Touch LLE Coordination: WFL - gross motor   Balance    End of Session PT - End of Session Equipment Utilized During Treatment: Gait belt;Oxygen Activity Tolerance: Patient tolerated treatment well Patient left: in bed;with call bell/phone within reach;with family/visitor present  GP     RUSSELL,CINDY 05/12/2013, 10:22 AM

## 2013-05-12 NOTE — Progress Notes (Signed)
[name], [name]                 ACCOUNT NO.:  [id number]  MEDICAL RECORD NO.:  [id number]  LOCATION:  A321                          FACILITY:  [facility]  PHYSICIAN:  Mila Homer. [name], M.D.DATE OF BIRTH:  1935-05-26  DATE OF PROCEDURE: DATE OF DISCHARGE:                                PROGRESS NOTE   SUBJECTIVE:  She is feeling better.  OBJECTIVE:  VITAL SIGNS:  Temperature is 98.4, pulse 100, respiratory rate 20, blood pressure 145/68. GENERAL:  She is sitting up in bed having breakfast.  She is oriented and alert.  She is well developed.  She is on oxygen.  Her color is good. NEUROLOGIC:  Her speech is normal.  Her sensorium is intact.  Her affect is good. LUNGS:  Decreased breath sounds throughout with occasional expiatory wheezing and rhonchi posteriorly. HEART:  Regular rhythm.  Rate of about 90.  Her re-check chest x-ray yesterday showed what appeared to be left basilar atelectasis and emphysema.  ASSESSMENT: 1. Pneumonia. 2. Delirium secondary to pneumonia. 3. Chronic obstructive pulmonary disease. 4. Benign essential hypertension. 5. Rheumatoid arthritis. 6. Long-term use of high risk medication.  PLAN:  We will continue with ceftriaxone and azithromycin IV today. Physical Therapy will get her up.  I will plan to discharge her tomorrow if she is doing well, and we will use p.o. antibiotics for that.  I have discussed it with Discharge Planning.     Mila Homer. [name], M.D.     SDK/MEDQ  D:  05/12/2013  T:  05/12/2013  Job:  [id number]

## 2013-05-12 NOTE — Progress Notes (Signed)
05/12/13 1812 Patient's IV site noted to be out and bleeding around site this evening. Pt stated she preferred not to have IV site restarted since probable discharge home tomorrow. Notified Dr Janna Arch of patient request and only IV medication being Rocephin. Pt is on azithromycin po as well. Stated okay to leave IV access out. Earnstine Regal, RN

## 2013-05-12 NOTE — Plan of Care (Signed)
Problem: Phase I Progression Outcomes Goal: Progress activity as tolerated unless otherwise ordered Outcome: Progressing 05/12/13 1025 Patient had PT consult initiated today. Assisted up to chair per PT assist. Call light within reach, instructed to call for assistance and not attempt getting up on her own for safety. Stated understood and would call. Earnstine Regal ,RN

## 2013-05-13 LAB — LEGIONELLA ANTIGEN, URINE: Legionella Antigen, Urine: NEGATIVE

## 2013-05-13 MED ORDER — CEFUROXIME AXETIL 250 MG PO TABS: 500.0000 mg | ORAL_TABLET | Freq: Two times a day (BID) | ORAL | Status: AC

## 2013-05-13 NOTE — Progress Notes (Signed)
05/13/13 1557 Reviewed discharge instructions with patient and grand-daughter at bedside. Given copy of instructions, medication list, prescription, and f/u information. Pt states will call Dr Michelle Nasuti office for appointment as instructed. Reviewed education sheets for pneumonia and UTI with patient. No c/o pain or discomfort at time of discharge. Pt needed new portable O2 for discharge home per family. Stated current portable O2 device they brought from home has been leaking and not working properly. Notified [employer], stated they could deliver O2 tank for discharge home. Portable O2 device would have to be delivered to her home since needed liquid oxygen.  Patient and family stated that would be okay. O2 tank delivered and present for discharge home. Pt left floor in stable condition via w/c accompanied by nurse tech. Earnstine Regal, RN

## 2013-05-14 NOTE — Discharge Summary (Signed)
NAMEAVIANCE, COOPERWOOD                 ACCOUNT NO.:  [id number]  MEDICAL RECORD NO.:  [id number]  LOCATION:  A321                          FACILITY:  [facility]  PHYSICIAN:  Mila Homer. [name], M.D.DATE OF BIRTH:  29-Dec-1934  DATE OF ADMISSION:  05/09/2013 DATE OF DISCHARGE:  05/29/2014LH                              DISCHARGE SUMMARY   This 78 year old was admitted to the hospital with acute confusion, which turned out to be secondary to pneumonia.  She had a benign 5-day hospitalization extending from May 25th to May 13, 2013.  Vital signs remained stable.  Admission white cell count was 8500, of which 69% neutrophils.  Recheck white cell count was 7500.  Hemoglobin was 13.1 and on recheck 12.8.  Her CMP was essentially normal.  Her vitamin B12 level was 4.3.  Blood gases showed pH 7.37, PCO2 56, PO2 84, bicarb 32.  The TSH is 0.968.  Sugars were 178 and 120.  RPR was nonreactive.  UA showed 11-20 WBCs, 6 RBCs per HPF.  Trace leukocytes noted on stick. Urine was negative for Legionella antigen.  It was also negative for strep pneumonia.  Urine culture grew out 35,000 colonies, but there were multiple bacterial morphotypes, none of which were predominant.  CT scan of the head without contrast showed atrophy and white matter microvascular disease but no acute intracranial findings.  Chest x-ray showed a left basilar airspace opacification which is felt to be a possible pneumonia.  Repeat chest x-ray showed a persistent minimal left base atelectasis.  The patient was treated as a community-acquired pneumonia with ceftriaxone and azithromycin.  She was on IV fluids.  Within several days, she was much better, more herself, alert and oriented.  She is continued on: 1. Alprazolam 1 mg up to q.i.d. 2. Amlodipine 10 mg daily. 3. Benazepril 20 mg daily. 4. Fluoxetine 60 mg daily. 5. Hydralazine 10 mg q.6 hours p.r.n. 6. Pantoprazole 40 mg daily. 7. Simvastatin 40 mg daily. 8.  Vitamin D 50,000 International Units q. week. 9. Methotrexate 17.5 mg weekly. She had reached maximum hospital benefit and was ready for discharge home on the 5th day.  She is to continue all of home meds, as noted above.  In addition, she was to be on cefuroxime 500 mg b.i.d. for a 7-day course (14, with no refills).  She is to see me in followup early next week, which will be about 4 or 5 days from now.  FINAL DISCHARGE DIAGNOSES: 1. Delirium secondary to a community-acquired pneumonia. 2. Community-acquired pneumonia. 3. Chronic obstructive pulmonary disease. 4. Personal history of tobacco use. 5. Benign essential hypertension. 6. Rheumatoid arthritis. 7. Long-term use of high risk medication.     Mila Homer. [name], M.D.     SDK/MEDQ  D:  05/13/2013  T:  05/14/2013  Job:  [id number]

## 2013-05-25 ENCOUNTER — Encounter (HOSPITAL_BASED_OUTPATIENT_CLINIC_OR_DEPARTMENT_OTHER): Payer: Self-pay | Admitting: *Deleted

## 2013-05-25 ENCOUNTER — Emergency Department (HOSPITAL_BASED_OUTPATIENT_CLINIC_OR_DEPARTMENT_OTHER)
Admission: EM | Admit: 2013-05-25 | Discharge: 2013-05-26 | Disposition: A | Discharge status: Home / Self Care | Payer: Medicare Other | Attending: Emergency Medicine | Admitting: Emergency Medicine

## 2013-05-25 ENCOUNTER — Emergency Department (HOSPITAL_BASED_OUTPATIENT_CLINIC_OR_DEPARTMENT_OTHER): Payer: Medicare Other

## 2013-05-25 DIAGNOSIS — Y9289 Other specified places as the place of occurrence of the external cause: Secondary | ICD-10-CM | POA: Insufficient documentation

## 2013-05-25 DIAGNOSIS — Z79899 Other long term (current) drug therapy: Secondary | ICD-10-CM | POA: Insufficient documentation

## 2013-05-25 DIAGNOSIS — S93409A Sprain of unspecified ligament of unspecified ankle, initial encounter: Secondary | ICD-10-CM | POA: Insufficient documentation

## 2013-05-25 DIAGNOSIS — J449 Chronic obstructive pulmonary disease, unspecified: Secondary | ICD-10-CM | POA: Insufficient documentation

## 2013-05-25 DIAGNOSIS — I10 Essential (primary) hypertension: Secondary | ICD-10-CM | POA: Insufficient documentation

## 2013-05-25 DIAGNOSIS — Z8673 Personal history of transient ischemic attack (TIA), and cerebral infarction without residual deficits: Secondary | ICD-10-CM | POA: Insufficient documentation

## 2013-05-25 DIAGNOSIS — J4489 Other specified chronic obstructive pulmonary disease: Secondary | ICD-10-CM | POA: Insufficient documentation

## 2013-05-25 DIAGNOSIS — M129 Arthropathy, unspecified: Secondary | ICD-10-CM | POA: Insufficient documentation

## 2013-05-25 DIAGNOSIS — S93401A Sprain of unspecified ligament of right ankle, initial encounter: Secondary | ICD-10-CM

## 2013-05-25 DIAGNOSIS — X500XXA Overexertion from strenuous movement or load, initial encounter: Secondary | ICD-10-CM | POA: Insufficient documentation

## 2013-05-25 DIAGNOSIS — F411 Generalized anxiety disorder: Secondary | ICD-10-CM | POA: Insufficient documentation

## 2013-05-25 DIAGNOSIS — Y9389 Activity, other specified: Secondary | ICD-10-CM | POA: Insufficient documentation

## 2013-05-25 DIAGNOSIS — E119 Type 2 diabetes mellitus without complications: Secondary | ICD-10-CM | POA: Insufficient documentation

## 2013-05-25 DIAGNOSIS — Z87891 Personal history of nicotine dependence: Secondary | ICD-10-CM | POA: Insufficient documentation

## 2013-05-25 MED ORDER — HYDROCODONE-ACETAMINOPHEN 5-325 MG PO TABS: 1.0000 | ORAL_TABLET | Freq: Four times a day (QID) | ORAL | Status: AC | PRN

## 2013-05-25 MED ORDER — OXYCODONE-ACETAMINOPHEN 5-325 MG PO TABS
1.0000 | ORAL_TABLET | Freq: Once | ORAL | Status: AC
  Filled 2013-05-25: qty 1

## 2013-05-25 MED ORDER — OXYCODONE-ACETAMINOPHEN 5-325 MG PO TABS
ORAL_TABLET | ORAL | Status: AC
  Administered 2013-05-25: 1 via ORAL
  Filled 2013-05-25: qty 1

## 2013-05-25 NOTE — ED Provider Notes (Signed)
History     CSN: [id number]  Arrival date & time 05/25/13  2207   First MD Initiated Contact with Patient 05/25/13 2307      Chief Complaint  Patient presents with  . Ankle Pain    (Consider location/radiation/quality/duration/timing/severity/associated sxs/prior treatment) Patient is a 78 y.o. female presenting with ankle pain. The history is provided by the patient.  Ankle Pain Location:  Ankle Injury: yes   Mechanism of injury comment:  Twisted the right ankle 2 pm Ankle location:  R ankle Pain details:    Quality:  Aching   Radiates to:  Does not radiate   Severity:  Severe   Onset quality:  Sudden   Timing:  Constant   Progression:  Unchanged Relieved by:  Nothing Worsened by:  Nothing tried Ineffective treatments:  None tried Associated symptoms: no back pain   Risk factors: no concern for non-accidental trauma     Past Medical History  Diagnosis Date  . COPD (chronic obstructive pulmonary disease)   . Stroke   . Hypertension   . Anxiety   . Arthritis   . Diabetes mellitus without complication     Past Surgical History  Procedure Laterality Date  . Right hip surgery    . Esophageal dilation      History reviewed. No pertinent family history.  History  Substance Use Topics  . Smoking status: Former Games developer  . Smokeless tobacco: Not on file  . Alcohol Use: No    OB History   Grav Para Term Preterm Abortions TAB SAB Ect Mult Living                  Review of Systems  Musculoskeletal: Negative for back pain.  All other systems reviewed and are negative.    Allergies  Morphine and related  Home Medications   Current Outpatient Rx  Name  Route  Sig  Dispense  Refill  . albuterol (PROVENTIL HFA;VENTOLIN HFA) 108 (90 BASE) MCG/ACT inhaler   Inhalation   Inhale 2 puffs into the lungs every 6 (six) hours as needed for wheezing.         Marland Kitchen ALPRAZolam (XANAX) 1 MG tablet   Oral   Take 1 mg by mouth 4 (four) times daily.         Marland Kitchen  amLODipine-benazepril (LOTREL) 10-20 MG per capsule   Oral   Take 1 capsule by mouth daily.         . DULoxetine (CYMBALTA) 60 MG capsule   Oral   Take 60 mg by mouth daily.         Marland Kitchen HYDROcodone-acetaminophen (NORCO) 10-325 MG per tablet   Oral   Take 2 tablets by mouth every 6 (six) hours as needed for pain.         . methotrexate (RHEUMATREX) 2.5 MG tablet   Oral   Take 17.5 mg by mouth once a week. Caution:Chemotherapy. Protect from light. Takes on saturdays.         . pantoprazole (PROTONIX) 40 MG tablet   Oral   Take 40 mg by mouth daily.         . simvastatin (ZOCOR) 40 MG tablet   Oral   Take 40 mg by mouth every evening.         . Vitamin D, Ergocalciferol, (DRISDOL) 50000 UNITS CAPS   Oral   Take 50,000 Units by mouth every 7 (seven) days. Takes on saturdays         . cefUROXime (  CEFTIN) 250 MG tablet   Oral   Take 2 tablets (500 mg total) by mouth 2 (two) times daily.   14 tablet   0     BP 141/63  Pulse 101  Temp(Src) 98.8 F (37.1 C) (Oral)  Resp 22  SpO2 95%  Physical Exam  Constitutional: She is oriented to person, place, and time. She appears well-developed and well-nourished. No distress.  HENT:  Head: Normocephalic and atraumatic.  Mouth/Throat: Oropharynx is clear and moist.  Eyes: Conjunctivae are normal. Pupils are equal, round, and reactive to light.  Neck: Normal range of motion. Neck supple.  Cardiovascular: Normal rate, regular rhythm and intact distal pulses.   Pulmonary/Chest: Effort normal and breath sounds normal. She has no wheezes.  Abdominal: Soft. Bowel sounds are normal. There is no tenderness. There is no rebound and no guarding.  Musculoskeletal: Normal range of motion. She exhibits no edema.  Cap refill < 2 sec FROM of the right ankle   Neurological: She is alert and oriented to person, place, and time.  Skin: Skin is warm and dry.  Psychiatric: She has a normal mood and affect.    ED Course  Procedures  (including critical care time)  Labs Reviewed - No data to display Dg Ankle Complete Right  05/25/2013   *RADIOLOGY REPORT*  Clinical Data: Right ankle pain after falling down steps.  RIGHT ANKLE - COMPLETE 3+ VIEW  Comparison: None.  Findings: Diffuse bone demineralization. No evidence of acute fracture or subluxation.  No focal bone lesions.  Bone matrix and cortex appear intact.  No abnormal radiopaque densities in the soft tissues.  Small Achilles calcaneal spur.  Vascular calcifications.  IMPRESSION: Diffuse bone demineralization.  No acute fractures demonstrated in the right ankle.   Original Report Authenticated By: Burman Nieves, M.D.     No diagnosis found.    MDM  Ice elevation pain medicatrion        Agape Hardiman Smitty Cords, MD 05/25/13 2334

## 2013-05-25 NOTE — ED Notes (Signed)
Patient transported to X-ray 

## 2013-05-25 NOTE — ED Notes (Signed)
Pt fell and twisted ankle 8 hours ago, took pain pill after injury then took a nap. Pt now here after waking with increased pain.

## 2014-09-15 ENCOUNTER — Encounter (HOSPITAL_COMMUNITY): Payer: Self-pay | Admitting: Emergency Medicine

## 2014-09-15 ENCOUNTER — Emergency Department (HOSPITAL_COMMUNITY)
Admission: EM | Admit: 2014-09-15 | Discharge: 2014-09-15 | Disposition: A | Discharge status: Home / Self Care | Payer: Medicare Other | Attending: Emergency Medicine | Admitting: Emergency Medicine

## 2014-09-15 ENCOUNTER — Emergency Department (HOSPITAL_COMMUNITY): Payer: Medicare Other

## 2014-09-15 DIAGNOSIS — F419 Anxiety disorder, unspecified: Secondary | ICD-10-CM | POA: Insufficient documentation

## 2014-09-15 DIAGNOSIS — R0602 Shortness of breath: Secondary | ICD-10-CM | POA: Diagnosis present

## 2014-09-15 DIAGNOSIS — I10 Essential (primary) hypertension: Secondary | ICD-10-CM | POA: Insufficient documentation

## 2014-09-15 DIAGNOSIS — E119 Type 2 diabetes mellitus without complications: Secondary | ICD-10-CM | POA: Diagnosis not present

## 2014-09-15 DIAGNOSIS — Z79899 Other long term (current) drug therapy: Secondary | ICD-10-CM | POA: Diagnosis not present

## 2014-09-15 DIAGNOSIS — J441 Chronic obstructive pulmonary disease with (acute) exacerbation: Secondary | ICD-10-CM

## 2014-09-15 DIAGNOSIS — Z8673 Personal history of transient ischemic attack (TIA), and cerebral infarction without residual deficits: Secondary | ICD-10-CM | POA: Insufficient documentation

## 2014-09-15 DIAGNOSIS — Z8739 Personal history of other diseases of the musculoskeletal system and connective tissue: Secondary | ICD-10-CM | POA: Diagnosis not present

## 2014-09-15 DIAGNOSIS — Z87891 Personal history of nicotine dependence: Secondary | ICD-10-CM | POA: Diagnosis not present

## 2014-09-15 LAB — BLOOD GAS, ARTERIAL
ACID-BASE EXCESS: 7 mmol/L — AB (ref 0.0–2.0)
Bicarbonate: 32.6 mEq/L — ABNORMAL HIGH (ref 20.0–24.0)
Drawn by: 38235
FIO2: 0.32 %
O2 SAT: 98.8 %
PATIENT TEMPERATURE: 37
TCO2: 29.5 mmol/L (ref 0–100)
pCO2 arterial: 61.9 mmHg (ref 35.0–45.0)
pH, Arterial: 7.341 — ABNORMAL LOW (ref 7.350–7.450)
pO2, Arterial: 160 mmHg — ABNORMAL HIGH (ref 80.0–100.0)

## 2014-09-15 LAB — COMPREHENSIVE METABOLIC PANEL
ALT: 20 U/L (ref 0–35)
AST: 35 U/L (ref 0–37)
Albumin: 4.1 g/dL (ref 3.5–5.2)
Alkaline Phosphatase: 70 U/L (ref 39–117)
Anion gap: 14 (ref 5–15)
BUN: 11 mg/dL (ref 6–23)
CALCIUM: 9.9 mg/dL (ref 8.4–10.5)
CO2: 31 meq/L (ref 19–32)
CREATININE: 0.53 mg/dL (ref 0.50–1.10)
Chloride: 93 mEq/L — ABNORMAL LOW (ref 96–112)
GFR, EST NON AFRICAN AMERICAN: 89 mL/min — AB (ref >90)
Glucose, Bld: 161 mg/dL — ABNORMAL HIGH (ref 70–99)
Potassium: 4 mEq/L (ref 3.7–5.3)
Sodium: 138 mEq/L (ref 137–147)
Total Bilirubin: 0.4 mg/dL (ref 0.3–1.2)
Total Protein: 7.6 g/dL (ref 6.0–8.3)

## 2014-09-15 LAB — CBC WITH DIFFERENTIAL/PLATELET
BASOS ABS: 0 10*3/uL (ref 0.0–0.1)
Basophils Relative: 1 % (ref 0–1)
EOS ABS: 0.1 10*3/uL (ref 0.0–0.7)
EOS PCT: 2 % (ref 0–5)
HEMATOCRIT: 38.8 % (ref 36.0–46.0)
Hemoglobin: 12.8 g/dL (ref 12.0–15.0)
LYMPHS PCT: 16 % (ref 12–46)
Lymphs Abs: 1.1 10*3/uL (ref 0.7–4.0)
MCH: 32 pg (ref 26.0–34.0)
MCHC: 33 g/dL (ref 30.0–36.0)
MCV: 97 fL (ref 78.0–100.0)
MONO ABS: 0.6 10*3/uL (ref 0.1–1.0)
Monocytes Relative: 9 % (ref 3–12)
Neutro Abs: 4.7 10*3/uL (ref 1.7–7.7)
Neutrophils Relative %: 72 % (ref 43–77)
Platelets: 139 10*3/uL — ABNORMAL LOW (ref 150–400)
RBC: 4 MIL/uL (ref 3.87–5.11)
RDW: 13 % (ref 11.5–15.5)
WBC: 6.6 10*3/uL (ref 4.0–10.5)

## 2014-09-15 LAB — TROPONIN I

## 2014-09-15 LAB — PRO B NATRIURETIC PEPTIDE: PRO B NATRI PEPTIDE: 313 pg/mL (ref 0–450)

## 2014-09-15 MED ORDER — LORAZEPAM 2 MG/ML IJ SOLN
1.0000 mg | Freq: Once | INTRAMUSCULAR | Status: AC
  Administered 2014-09-15: 1 mg via INTRAVENOUS
  Filled 2014-09-15: qty 1

## 2014-09-15 MED ORDER — ONDANSETRON HCL 4 MG/2ML IJ SOLN
4.0000 mg | Freq: Once | INTRAMUSCULAR | Status: AC
Start: 2014-09-15 — End: 2014-09-15
  Administered 2014-09-15: 4 mg via INTRAVENOUS
  Filled 2014-09-15: qty 2

## 2014-09-15 MED ORDER — ALBUTEROL SULFATE (2.5 MG/3ML) 0.083% IN NEBU
2.5000 mg | INHALATION_SOLUTION | Freq: Once | RESPIRATORY_TRACT | Status: AC
  Administered 2014-09-15: 2.5 mg via RESPIRATORY_TRACT
  Filled 2014-09-15: qty 3

## 2014-09-15 MED ORDER — ALPRAZOLAM 0.5 MG PO TABS: 0.5000 mg | ORAL_TABLET | Freq: Three times a day (TID) | ORAL | Status: AC | PRN

## 2014-09-15 MED ORDER — PREDNISONE 10 MG PO TABS: 20.0000 mg | ORAL_TABLET | Freq: Two times a day (BID) | ORAL | Status: AC

## 2014-09-15 MED ORDER — IPRATROPIUM-ALBUTEROL 0.5-2.5 (3) MG/3ML IN SOLN
3.0000 mL | Freq: Once | RESPIRATORY_TRACT | Status: AC
  Administered 2014-09-15: 3 mL via RESPIRATORY_TRACT
  Filled 2014-09-15: qty 3

## 2014-09-15 NOTE — ED Notes (Signed)
Patient via [facility] c/o "being sick" patient states she vomited X2 at home, and c/o shortness of breath. Patient has a history of COPD and wears O2 at home. Per EMS patient vomited X1 en route. Patient states she has not been feeling well for "a few days" patient is A&OX4 at this time.

## 2014-09-15 NOTE — ED Notes (Signed)
Pt c/o sob and was placed on cpap by ems enroute pt began dry heaving and cpap removed.

## 2014-09-15 NOTE — ED Notes (Signed)
Patient appears to be anxious and voicing concern stating "I know im sick, ya'll don't believe me" patient appears restless in the bed at this time. I reassured the patient that we are taking very good care of her. EDP notified of patients concerns and restlessness.

## 2014-09-15 NOTE — ED Notes (Signed)
CRITICAL VALUE ALERT  Critical value received:  ABG  Date of notification:  09/15/2014  Time of notification:  0416  Critical value read back:Yes.    Nurse who received alert:  Georg RuddleJustin RN  MD notified (1st page):  Dr. Judd Lienelo  Time of first page:  [phone]  MD notified (2nd page):  Time of second page:  Responding MD:  Dr. Judd Lienelo  Time MD responded:  [phone]

## 2014-09-15 NOTE — ED Notes (Signed)
MD at bedside. 

## 2014-09-15 NOTE — Discharge Instructions (Signed)
Prednisone as prescribed.  Continue your albuterol treatments every 4 hours as needed.  Return to the ER if your symptoms substantially worsen or change.   Chronic Obstructive Pulmonary Disease Exacerbation Chronic obstructive pulmonary disease (COPD) is a common lung condition in which airflow from the lungs is limited. COPD is a general term that can be used to describe many different lung problems that limit airflow, including chronic bronchitis and emphysema. COPD exacerbations are episodes when breathing symptoms become much worse and require extra treatment. Without treatment, COPD exacerbations can be life threatening, and frequent COPD exacerbations can cause further damage to your lungs. CAUSES   Respiratory infections.   Exposure to smoke.   Exposure to air pollution, chemical fumes, or dust. Sometimes there is no apparent cause or trigger. RISK FACTORS  Smoking cigarettes.  Older age.  Frequent prior COPD exacerbations. SIGNS AND SYMPTOMS   Increased coughing.   Increased thick spit (sputum) production.   Increased wheezing.   Increased shortness of breath.   Rapid breathing.   Chest tightness. DIAGNOSIS  Your medical history, a physical exam, and tests will help your health care provider make a diagnosis. Tests may include:  A chest X-ray.  Basic lab tests.  Sputum testing.  An arterial blood gas test. TREATMENT  Depending on the severity of your COPD exacerbation, you may need to be admitted to a hospital for treatment. Some of the treatments commonly used to treat COPD exacerbations are:   Antibiotic medicines.   Bronchodilators. These are drugs that expand the air passages. They may be given with an inhaler or nebulizer. Spacer devices may be needed to help improve drug delivery.  Corticosteroid medicines.  Supplemental oxygen therapy.  HOME CARE INSTRUCTIONS   Do not smoke. Quitting smoking is very important to prevent COPD from  getting worse and exacerbations from happening as often.  Avoid exposure to all substances that irritate the airway, especially to tobacco smoke.   If you were prescribed an antibiotic medicine, finish it all even if you start to feel better.  Take all medicines as directed by your health care provider.It is important to use correct technique with inhaled medicines.  Drink enough fluids to keep your urine clear or pale yellow (unless you have a medical condition that requires fluid restriction).  Use a cool mist vaporizer. This makes it easier to clear your chest when you cough.   If you have a home nebulizer and oxygen, continue to use them as directed.   Maintain all necessary vaccinations to prevent infections.   Exercise regularly.   Eat a healthy diet.   Keep all follow-up appointments as directed by your health care provider. SEEK IMMEDIATE MEDICAL CARE IF:  You have worsening shortness of breath.   You have trouble talking.   You have severe chest pain.  You have blood in your sputum.  You have a fever.  You have weakness, vomit repeatedly, or faint.   You feel confused.   You continue to get worse. MAKE SURE YOU:   Understand these instructions.  Will watch your condition.  Will get help right away if you are not doing well or get worse. Document Released: 09/29/2007 Document Revised: 04/18/2014 Document Reviewed: 08/06/2013 [facility] Patient Information 2015 [employer], [place]. This information is not intended to replace advice given to you by your health care provider. Make sure you discuss any questions you have with your health care provider.

## 2014-09-15 NOTE — ED Provider Notes (Signed)
CSN: [id number]     Arrival date & time 09/15/14  [id number] History   First MD Initiated Contact with Patient 09/15/14 [phone]     Chief Complaint  Patient presents with  . Shortness of Breath     (Consider location/radiation/quality/duration/timing/severity/associated sxs/prior Treatment) HPI Comments: Patient is a 79 year old female with history of COPD, hypertension. She presents with complaints of shortness of breath and difficulty breathing. This started approximately 2 days ago and has gradually worsened. She reports some nausea and vomiting. She denies to me she is having any fever or productive cough.  Patient is a 79 y.o. female presenting with shortness of breath. The history is provided by the patient.  Shortness of Breath Severity:  Moderate Onset quality:  Gradual Duration:  2 days Timing:  Constant Progression:  Worsening Chronicity:  Recurrent Context: activity   Relieved by:  Nothing Worsened by:  Nothing tried Ineffective treatments:  None tried Associated symptoms: cough   Associated symptoms: no abdominal pain and no chest pain     Past Medical History  Diagnosis Date  . COPD (chronic obstructive pulmonary disease)   . Stroke   . Hypertension   . Anxiety   . Arthritis   . Diabetes mellitus without complication    Past Surgical History  Procedure Laterality Date  . Right hip surgery    . Esophageal dilation     History reviewed. No pertinent family history. History  Substance Use Topics  . Smoking status: Former Games developermoker  . Smokeless tobacco: Not on file  . Alcohol Use: No   OB History   Grav Para Term Preterm Abortions TAB SAB Ect Mult Living                 Review of Systems  Respiratory: Positive for cough and shortness of breath.   Cardiovascular: Negative for chest pain.  Gastrointestinal: Negative for abdominal pain.  All other systems reviewed and are negative.     Allergies  Morphine and related  Home Medications   Prior to Admission  medications   Medication Sig Start Date End Date Taking? Authorizing Provider  albuterol (PROVENTIL HFA;VENTOLIN HFA) 108 (90 BASE) MCG/ACT inhaler Inhale 2 puffs into the lungs every 6 (six) hours as needed for wheezing.   Yes Historical Provider, MD  ALPRAZolam Prudy Feeler(XANAX) 1 MG tablet Take 1 mg by mouth 4 (four) times daily.   Yes Historical Provider, MD  amLODipine-benazepril (LOTREL) 10-20 MG per capsule Take 1 capsule by mouth daily.   Yes Historical Provider, MD  DULoxetine (CYMBALTA) 60 MG capsule Take 60 mg by mouth daily.   Yes Historical Provider, MD  HYDROcodone-acetaminophen (NORCO) 10-325 MG per tablet Take 2 tablets by mouth every 6 (six) hours as needed for pain.   Yes Historical Provider, MD  methotrexate (RHEUMATREX) 2.5 MG tablet Take 17.5 mg by mouth once a week. Caution:Chemotherapy. Protect from light. Takes on saturdays.   Yes Historical Provider, MD  pantoprazole (PROTONIX) 40 MG tablet Take 40 mg by mouth daily.   Yes Historical Provider, MD  simvastatin (ZOCOR) 40 MG tablet Take 40 mg by mouth every evening.   Yes Historical Provider, MD  Vitamin D, Ergocalciferol, (DRISDOL) 50000 UNITS CAPS Take 50,000 Units by mouth every 7 (seven) days. Takes on saturdays   Yes Historical Provider, MD  cefUROXime (CEFTIN) 250 MG tablet Take 2 tablets (500 mg total) by mouth 2 (two) times daily. 05/13/13   Milana ObeyStephen D Knowlton, MD  HYDROcodone-acetaminophen (NORCO) 5-325 MG per tablet Take 1  tablet by mouth every 6 (six) hours as needed for pain. 05/25/13   April K Palumbo-Rasch, MD   There were no vitals taken for this visit. Physical Exam  Nursing note and vitals reviewed. Constitutional: She is oriented to person, place, and time. She appears well-developed and well-nourished. No distress.  HENT:  Head: Normocephalic and atraumatic.  Mouth/Throat: Oropharynx is clear and moist.  Neck: Normal range of motion. Neck supple.  Cardiovascular: Regular rhythm and normal heart sounds.  Exam  reveals no gallop and no friction rub.   No murmur heard. The heart is tachycardic but regular.  Pulmonary/Chest: She is in respiratory distress. She has wheezes. She has no rales.  The patient is tachypneic with poor air movement and expiratory wheezes. She is in moderate respiratory distress.  Abdominal: Soft. Bowel sounds are normal. She exhibits no distension. There is no tenderness.  Musculoskeletal: Normal range of motion. She exhibits no edema.  Lymphadenopathy:    She has no cervical adenopathy.  Neurological: She is alert and oriented to person, place, and time.  Skin: Skin is warm and dry. She is not diaphoretic.    ED Course  Procedures (including critical care time) Labs Review Labs Reviewed - No data to display  Imaging Review No results found.   EKG Interpretation   Date/Time:  Thursday September 15 2014 03:50:32 EDT Ventricular Rate:  95 PR Interval:  145 QRS Duration: 93 QT Interval:  382 QTC Calculation: 480 R Axis:   78 Text Interpretation:  Sinus rhythm Probable left atrial enlargement  Borderline ST depression, diffuse leads Confirmed by DELOS  MD, Yulia Ulrich  ([id number]) on 09/15/2014 4:28:33 AM      MDM   Final diagnoses:  None    Patient is a 79 year old female with history of COPD who presents with complaints of difficulty breathing. Workup was consistent with an exacerbation of COPD. There is no fever, white count, or evidence for pneumonia. Her BNP is in the normal range and chest x-ray does not show congestive heart failure. She is feeling better after steroids and nebulizer treatments and is adamant about going home. She does have a mild respiratory acidosis which I suspect may be her baseline. I've discussed admitting her, however she has not open to this. She will be discharged at her request. I will have her perform breathing treatments every 4 hours and prescribed prednisone for the next 5 days. She understands that she is welcome to return if her  symptoms substantially worsen or change.    Geoffery Lyons, MD 09/15/14 [phone]

## 2014-09-15 NOTE — ED Notes (Signed)
Patient and patients family verbalize understanding of discharge instructions, prescription medications, home care, and follow up care. Patient out of department at this time with family.

## 2016-12-16 DEATH — deceased
